# Patient Record
Sex: Female | Born: 1960
Health system: Southern US, Community
[De-identification: ages and names within clinical notes are randomized; demographics above are authoritative.]

## PROBLEM LIST (undated history)

## (undated) DIAGNOSIS — E785 Hyperlipidemia, unspecified: Secondary | ICD-10-CM

## (undated) DIAGNOSIS — M810 Age-related osteoporosis without current pathological fracture: Secondary | ICD-10-CM

## (undated) HISTORY — DX: Hyperlipidemia, unspecified: E78.5

## (undated) HISTORY — PX: CATARACT EXTRACTION, BILATERAL: SHX1313

## (undated) HISTORY — DX: Age-related osteoporosis without current pathological fracture: M81.0

---

## 2005-07-17 ENCOUNTER — Ambulatory Visit: Payer: Self-pay | Admitting: Obstetrics and Gynecology

## 2006-07-18 ENCOUNTER — Ambulatory Visit: Payer: Self-pay | Admitting: Obstetrics and Gynecology

## 2006-07-23 ENCOUNTER — Ambulatory Visit: Payer: Self-pay | Admitting: Obstetrics and Gynecology

## 2006-12-12 ENCOUNTER — Ambulatory Visit: Payer: Self-pay | Admitting: Family Medicine

## 2007-05-16 ENCOUNTER — Ambulatory Visit: Payer: Self-pay | Admitting: Obstetrics and Gynecology

## 2008-05-27 ENCOUNTER — Ambulatory Visit: Payer: Self-pay | Admitting: Obstetrics and Gynecology

## 2008-06-01 ENCOUNTER — Ambulatory Visit: Payer: Self-pay | Admitting: Obstetrics and Gynecology

## 2010-02-13 DIAGNOSIS — C4491 Basal cell carcinoma of skin, unspecified: Secondary | ICD-10-CM

## 2010-02-13 HISTORY — DX: Basal cell carcinoma of skin, unspecified: C44.91

## 2010-02-23 ENCOUNTER — Ambulatory Visit: Payer: Self-pay | Admitting: Obstetrics and Gynecology

## 2011-04-04 ENCOUNTER — Ambulatory Visit: Payer: Self-pay | Admitting: Obstetrics and Gynecology

## 2011-10-08 ENCOUNTER — Ambulatory Visit: Payer: Self-pay | Admitting: Gastroenterology

## 2011-10-09 ENCOUNTER — Ambulatory Visit: Payer: Self-pay | Admitting: Gastroenterology

## 2012-07-08 ENCOUNTER — Emergency Department: Payer: Self-pay | Admitting: Emergency Medicine

## 2012-12-31 ENCOUNTER — Ambulatory Visit: Payer: Self-pay | Admitting: Obstetrics and Gynecology

## 2014-10-18 ENCOUNTER — Ambulatory Visit: Payer: Self-pay | Admitting: Obstetrics and Gynecology

## 2016-03-23 ENCOUNTER — Other Ambulatory Visit: Payer: Self-pay | Admitting: Obstetrics and Gynecology

## 2016-03-23 DIAGNOSIS — Z1231 Encounter for screening mammogram for malignant neoplasm of breast: Secondary | ICD-10-CM

## 2016-04-04 ENCOUNTER — Other Ambulatory Visit: Payer: Self-pay | Admitting: Obstetrics and Gynecology

## 2016-04-04 ENCOUNTER — Ambulatory Visit
Admission: RE | Admit: 2016-04-04 | Discharge: 2016-04-04 | Disposition: A | Discharge status: Home / Self Care | Payer: No Typology Code available for payment source | Source: Ambulatory Visit | Attending: Obstetrics and Gynecology | Admitting: Obstetrics and Gynecology

## 2016-04-04 DIAGNOSIS — Z1231 Encounter for screening mammogram for malignant neoplasm of breast: Secondary | ICD-10-CM | POA: Diagnosis present

## 2016-12-04 DIAGNOSIS — H60542 Acute eczematoid otitis externa, left ear: Secondary | ICD-10-CM | POA: Diagnosis not present

## 2016-12-04 DIAGNOSIS — H6123 Impacted cerumen, bilateral: Secondary | ICD-10-CM | POA: Diagnosis not present

## 2016-12-31 DIAGNOSIS — I6529 Occlusion and stenosis of unspecified carotid artery: Secondary | ICD-10-CM | POA: Diagnosis not present

## 2016-12-31 DIAGNOSIS — E782 Mixed hyperlipidemia: Secondary | ICD-10-CM | POA: Diagnosis not present

## 2016-12-31 DIAGNOSIS — M5010 Cervical disc disorder with radiculopathy, unspecified cervical region: Secondary | ICD-10-CM | POA: Diagnosis not present

## 2017-01-02 DIAGNOSIS — E559 Vitamin D deficiency, unspecified: Secondary | ICD-10-CM | POA: Diagnosis not present

## 2017-01-02 DIAGNOSIS — Z0001 Encounter for general adult medical examination with abnormal findings: Secondary | ICD-10-CM | POA: Diagnosis not present

## 2017-01-02 DIAGNOSIS — R7301 Impaired fasting glucose: Secondary | ICD-10-CM | POA: Diagnosis not present

## 2017-01-02 DIAGNOSIS — E782 Mixed hyperlipidemia: Secondary | ICD-10-CM | POA: Diagnosis not present

## 2017-01-03 ENCOUNTER — Ambulatory Visit
Admission: RE | Admit: 2017-01-03 | Discharge: 2017-01-03 | Disposition: A | Discharge status: Home / Self Care | Payer: Commercial Managed Care - PPO | Source: Ambulatory Visit | Attending: Nurse Practitioner | Admitting: Nurse Practitioner

## 2017-01-03 ENCOUNTER — Other Ambulatory Visit: Payer: Self-pay | Admitting: Nurse Practitioner

## 2017-01-03 DIAGNOSIS — M542 Cervicalgia: Secondary | ICD-10-CM | POA: Diagnosis present

## 2017-01-03 DIAGNOSIS — R52 Pain, unspecified: Secondary | ICD-10-CM

## 2017-01-03 DIAGNOSIS — M5030 Other cervical disc degeneration, unspecified cervical region: Secondary | ICD-10-CM | POA: Insufficient documentation

## 2017-01-23 DIAGNOSIS — I6523 Occlusion and stenosis of bilateral carotid arteries: Secondary | ICD-10-CM | POA: Diagnosis not present

## 2017-02-06 DIAGNOSIS — E782 Mixed hyperlipidemia: Secondary | ICD-10-CM | POA: Diagnosis not present

## 2017-02-06 DIAGNOSIS — M5010 Cervical disc disorder with radiculopathy, unspecified cervical region: Secondary | ICD-10-CM | POA: Diagnosis not present

## 2017-02-06 DIAGNOSIS — I6523 Occlusion and stenosis of bilateral carotid arteries: Secondary | ICD-10-CM | POA: Diagnosis not present

## 2017-05-16 ENCOUNTER — Other Ambulatory Visit: Payer: Self-pay | Admitting: Obstetrics and Gynecology

## 2017-05-16 DIAGNOSIS — Z1231 Encounter for screening mammogram for malignant neoplasm of breast: Secondary | ICD-10-CM

## 2017-06-05 ENCOUNTER — Ambulatory Visit
Admission: RE | Admit: 2017-06-05 | Discharge: 2017-06-05 | Disposition: A | Discharge status: Home / Self Care | Payer: No Typology Code available for payment source | Source: Ambulatory Visit | Attending: Obstetrics and Gynecology | Admitting: Obstetrics and Gynecology

## 2017-06-05 DIAGNOSIS — Z1231 Encounter for screening mammogram for malignant neoplasm of breast: Secondary | ICD-10-CM | POA: Insufficient documentation

## 2017-06-27 DIAGNOSIS — J019 Acute sinusitis, unspecified: Secondary | ICD-10-CM | POA: Diagnosis not present

## 2017-06-27 DIAGNOSIS — R6883 Chills (without fever): Secondary | ICD-10-CM | POA: Diagnosis not present

## 2017-06-27 DIAGNOSIS — R05 Cough: Secondary | ICD-10-CM | POA: Diagnosis not present

## 2017-08-19 DIAGNOSIS — J019 Acute sinusitis, unspecified: Secondary | ICD-10-CM | POA: Diagnosis not present

## 2017-08-19 DIAGNOSIS — M5010 Cervical disc disorder with radiculopathy, unspecified cervical region: Secondary | ICD-10-CM | POA: Diagnosis not present

## 2017-08-19 DIAGNOSIS — M791 Myalgia: Secondary | ICD-10-CM | POA: Diagnosis not present

## 2017-11-27 ENCOUNTER — Ambulatory Visit: Payer: Self-pay | Admitting: Nurse Practitioner

## 2018-01-23 ENCOUNTER — Ambulatory Visit: Payer: Self-pay | Admitting: Nurse Practitioner

## 2018-01-23 ENCOUNTER — Other Ambulatory Visit: Payer: Self-pay | Admitting: Nurse Practitioner

## 2018-01-23 ENCOUNTER — Other Ambulatory Visit: Payer: Self-pay

## 2018-01-23 ENCOUNTER — Telehealth: Payer: Self-pay

## 2018-01-23 DIAGNOSIS — J0140 Acute pansinusitis, unspecified: Secondary | ICD-10-CM

## 2018-01-23 DIAGNOSIS — F411 Generalized anxiety disorder: Secondary | ICD-10-CM

## 2018-01-23 DIAGNOSIS — L209 Atopic dermatitis, unspecified: Secondary | ICD-10-CM

## 2018-01-23 DIAGNOSIS — E782 Mixed hyperlipidemia: Secondary | ICD-10-CM

## 2018-01-23 MED ORDER — TRIAMCINOLONE ACETONIDE 0.025 % EX CREA
1.0000 | TOPICAL_CREAM | Freq: Two times a day (BID) | CUTANEOUS | 0 refills | Status: AC
Start: 2018-01-23 — End: ?

## 2018-01-23 MED ORDER — SIMVASTATIN 20 MG PO TABS: 20.0000 mg | ORAL_TABLET | Freq: Every day | ORAL | 3 refills | Status: DC

## 2018-01-23 MED ORDER — SULFAMETHOXAZOLE-TRIMETHOPRIM 800-160 MG PO TABS: 1.0000 | ORAL_TABLET | Freq: Two times a day (BID) | ORAL | 0 refills | Status: DC

## 2018-01-23 MED ORDER — ALPRAZOLAM 0.5 MG PO TBDP: 0.5000 mg | ORAL_TABLET | Freq: Two times a day (BID) | ORAL | 0 refills | Status: DC | PRN

## 2018-01-23 NOTE — Telephone Encounter (Signed)
-----   Message from Ronnell Freshwater, NP sent at 01/23/2018  1:10 PM EST ----- Contact: [phone] Sent requested refills to [address]. Also sent in bactrim BID for 10 days for sinusitis.  ----- Message ----- From: Corlis Hove Sent: 01/23/2018  11:33 AM To: Ronnell Freshwater, NP    ----- Message ----- From: Gurney Maxin Sent: 01/23/2018   8:59 AM To: Corlis Hove  PT NEED THE FOLLOWING CALLED IN PLEASE SIMVASTAIN 20 MG 20 TABS,TACREAM USP 0.025%,ALPRAZOLAM 0.5 MG XANAX 30 TABS AND ALSO SOMETHING FOR SINUS INFECTION THE SAME THAT THEY SENT HER LAST TIME.

## 2018-01-23 NOTE — Progress Notes (Signed)
Sent requested refills to [address]. Also sent in bactrim BID for 10 days for sinusitis.

## 2018-01-23 NOTE — Telephone Encounter (Signed)
LMOM THAT WE MED AND ALSO ANTIBIOTIC FOR SINUS INFECTION

## 2018-02-25 ENCOUNTER — Ambulatory Visit: Payer: Commercial Managed Care - PPO | Admitting: Nurse Practitioner

## 2018-02-25 ENCOUNTER — Encounter: Payer: Self-pay | Admitting: Nurse Practitioner

## 2018-02-25 VITALS — BP 108/70 | HR 82 | Resp 16 | Ht 59.0 in | Wt 118.0 lb

## 2018-02-25 DIAGNOSIS — G44031 Episodic paroxysmal hemicrania, intractable: Secondary | ICD-10-CM | POA: Diagnosis not present

## 2018-02-25 DIAGNOSIS — F411 Generalized anxiety disorder: Secondary | ICD-10-CM | POA: Insufficient documentation

## 2018-02-25 DIAGNOSIS — H60312 Diffuse otitis externa, left ear: Secondary | ICD-10-CM | POA: Diagnosis not present

## 2018-02-25 DIAGNOSIS — R300 Dysuria: Secondary | ICD-10-CM

## 2018-02-25 DIAGNOSIS — B379 Candidiasis, unspecified: Secondary | ICD-10-CM | POA: Diagnosis not present

## 2018-02-25 LAB — POCT URINALYSIS DIPSTICK
Bilirubin: NEGATIVE
Glucose, UA: NEGATIVE
Ketones, UA: NEGATIVE
LEUKOCYTES UA: NEGATIVE
NITRITE UA: NEGATIVE
PH UA: 7.5 (ref 5.0–8.0)
Protein, UA: NEGATIVE
RBC UA: NEGATIVE
SPEC GRAV UA: 1.01 (ref 1.010–1.025)
UROBILINOGEN UA: 0.2 U/dL

## 2018-02-25 MED ORDER — FLUCONAZOLE 150 MG PO TABS: ORAL_TABLET | ORAL | 0 refills | Status: DC

## 2018-02-25 MED ORDER — ALPRAZOLAM 0.5 MG PO TABS: 0.5000 mg | ORAL_TABLET | Freq: Every evening | ORAL | 3 refills | Status: DC | PRN

## 2018-02-25 MED ORDER — NEOMYCIN-POLYMYXIN-HC 3.5-10000-1 OT SOLN: 4.0000 [drp] | Freq: Three times a day (TID) | OTIC | 0 refills | Status: DC

## 2018-02-25 NOTE — Progress Notes (Signed)
[facility] [place], [place] [address]  Internal MEDICINE  Office Visit Note  Patient Name: Alison Oliver  [id number]  [id number]  Date of Service: 02/25/2018  Chief Complaint  Patient presents with  . Follow-up  . Urinary Tract Infection    burning while urination    The patient is here for routine follow up exam. She reports vaginal itching and irritation. Sometimes has some burning when she urinates. Has been going on for little over a week. She also reports some left ear congestion. Was treated for sinusitis earlier this year. This feeling of water in the ear has not resolved. She is very concerned about this as her sister did have acoustic neuroma. She complained of very similar symptoms prior to this diagnosis.    Pt is here for routine follow up.    Current Medication: Outpatient Encounter Medications as of 02/25/2018  Medication Sig  . ALPRAZolam (XANAX) 0.5 MG tablet Take 1 tablet (0.5 mg total) by mouth at bedtime as needed for anxiety.  . [DISCONTINUED] ALPRAZolam (XANAX) 0.5 MG tablet Take 0.5 mg by mouth at bedtime as needed for anxiety.  Marland Kitchen neomycin-polymyxin-hydrocortisone (CORTISPORIN) OTIC solution Place 4 drops into the left ear 3 (three) times daily.  . simvastatin (ZOCOR) 20 MG tablet Take 1 tablet (20 mg total) by mouth at bedtime.  . sulfamethoxazole-trimethoprim (BACTRIM DS,SEPTRA DS) 800-160 MG tablet Take 1 tablet by mouth 2 (two) times daily. (Patient not taking: Reported on 02/25/2018)  . triamcinolone (KENALOG) 0.025 % cream Apply 1 application topically 2 (two) times daily.  . [DISCONTINUED] ALPRAZolam (NIRAVAM) 0.5 MG dissolvable tablet Take 1 tablet (0.5 mg total) by mouth 2 (two) times daily as needed for anxiety. (Patient not taking: Reported on 02/25/2018)   No facility-administered encounter medications on file as of 02/25/2018.     Surgical History: Past Surgical History:  Procedure Laterality Date  . CESAREAN SECTION       Medical History: Past Medical History:  Diagnosis Date  . Hyperlipidemia   . Osteoporosis     Family History: Family History  Problem Relation Age of Onset  . Breast cancer Maternal Aunt 54  . Breast cancer Maternal Grandmother   . Bladder Cancer Father   . Prostate cancer Brother     Social History   Socioeconomic History  . Marital status: Married    Spouse name: Not on file  . Number of children: Not on file  . Years of education: Not on file  . Highest education level: Not on file  Occupational History  . Not on file  Social Needs  . Financial resource strain: Not on file  . Food insecurity:    Worry: Not on file    Inability: Not on file  . Transportation needs:    Medical: Not on file    Non-medical: Not on file  Tobacco Use  . Smoking status: Never Smoker  . Smokeless tobacco: Never Used  Substance and Sexual Activity  . Alcohol use: Yes    Comment: social   . Drug use: Never  . Sexual activity: Not on file  Lifestyle  . Physical activity:    Days per week: Not on file    Minutes per session: Not on file  . Stress: Not on file  Relationships  . Social connections:    Talks on phone: Not on file    Gets together: Not on file    Attends religious service: Not on file  Active member of club or organization: Not on file    Attends meetings of clubs or organizations: Not on file    Relationship status: Not on file  . Intimate partner violence:    Fear of current or ex partner: Not on file    Emotionally abused: Not on file    Physically abused: Not on file    Forced sexual activity: Not on file  Other Topics Concern  . Not on file  Social History Narrative  . Not on file      Review of Systems  Constitutional: Negative for activity change, chills, fatigue and unexpected weight change.  HENT: Positive for congestion. Negative for postnasal drip, rhinorrhea, sneezing and sore throat.        Feeling of fullness in the ears, especially on  the left side.   Eyes: Negative.  Negative for redness.  Respiratory: Negative for cough, chest tightness and shortness of breath.   Cardiovascular: Negative for chest pain and palpitations.  Gastrointestinal: Negative for abdominal pain, constipation, diarrhea, nausea and vomiting.  Endocrine: Negative for cold intolerance, heat intolerance, polydipsia, polyphagia and polyuria.  Genitourinary: Positive for dysuria. Negative for frequency.       Vaginal itching and irritation  Musculoskeletal: Negative for arthralgias, back pain, joint swelling and neck pain.  Skin: Negative for rash.  Allergic/Immunologic: Positive for environmental allergies.  Neurological: Negative for dizziness, tremors, numbness and headaches.  Hematological: Negative for adenopathy. Does not bruise/bleed easily.  Psychiatric/Behavioral: Negative for behavioral problems (Depression), sleep disturbance and suicidal ideas. The patient is nervous/anxious.     Today's Vitals   02/25/18 0847  BP: 108/70  Pulse: 82  Resp: 16  SpO2: 95%  Weight: 118 lb (53.5 kg)  Height: 4\' 11"  (1.499 m)     Physical Exam  Constitutional: She is oriented to person, place, and time. She appears well-developed and well-nourished. No distress.  HENT:  Head: Normocephalic and atraumatic.  Right Ear: Tympanic membrane is bulging.  Left Ear: Tympanic membrane is bulging.  Mouth/Throat: Oropharynx is clear and moist. No oropharyngeal exudate.  Eyes: Pupils are equal, round, and reactive to light. EOM are normal.  Neck: Normal range of motion. Neck supple. No JVD present. No tracheal deviation present. No thyromegaly present.  Cardiovascular: Normal rate, regular rhythm and normal heart sounds. Exam reveals no gallop and no friction rub.  No murmur heard. Pulmonary/Chest: Effort normal and breath sounds normal. No respiratory distress. She has no wheezes. She has no rales. She exhibits no tenderness.  Abdominal: Soft. Bowel sounds are  normal. There is no tenderness.  Genitourinary:  Genitourinary Comments: Urine sample negative for infection or other abnormalities  Musculoskeletal: Normal range of motion.  Lymphadenopathy:    She has no cervical adenopathy.  Neurological: She is alert and oriented to person, place, and time. No cranial nerve deficit. Coordination normal.  Skin: Skin is warm and dry. She is not diaphoretic.  Psychiatric: She has a normal mood and affect. Her behavior is normal. Judgment and thought content normal.  Nursing note and vitals reviewed.   Assessment/Plan:  1. Chronic diffuse otitis externa of left ear - neomycin-polymyxin-hydrocortisone (CORTISPORIN) OTIC solution; Place 4 drops into the left ear 3 (three) times daily.  Dispense: 10 mL; Refill: 0  2. Dysuria - POCT Urinalysis Dipstick - negative for acute infection or other abnormalities. Suspect yeast infection.  3. Candidiasis - fluconazole (DIFLUCAN) 150 MG tablet; Take 1 tablet po once. May repeat dose in 3 days as needed for  persistent symptoms.  Dispense: 3 tablet; Refill: 0  4. Intractable episodic paroxysmal hemicrania Family history of acoustic neuroma - CT Head Wo Contrast; Future  5. Generalized anxiety disorder - ALPRAZolam (XANAX) 0.5 MG tablet; Take 1 tablet (0.5 mg total) by mouth at bedtime as needed for anxiety.  Dispense: 30 tablet; Refill: 3   General Counseling: Kaaren verbalizes understanding of the findings of todays visit and agrees with plan of treatment. I have discussed any further diagnostic evaluation that may be needed or ordered today. We also reviewed her medications today. she has been encouraged to call the office with any questions or concerns that should arise related to todays visit.  This patient was seen by Leretha Pol, FNP- C in Collaboration with Dr Lavera Guise as a part of collaborative care agreement   Orders Placed This Encounter  Procedures  . CT Head Wo Contrast  . POCT Urinalysis  Dipstick    Meds ordered this encounter  Medications  . neomycin-polymyxin-hydrocortisone (CORTISPORIN) OTIC solution    Sig: Place 4 drops into the left ear 3 (three) times daily.    Dispense:  10 mL    Refill:  0    Order Specific Question:   Supervising Provider    Answer:   Lavera Guise [[id number]]  . ALPRAZolam (XANAX) 0.5 MG tablet    Sig: Take 1 tablet (0.5 mg total) by mouth at bedtime as needed for anxiety.    Dispense:  30 tablet    Refill:  3    Order Specific Question:   Supervising Provider    Answer:   Lavera Guise [[id number]]    Time spent 74 Minutes   Dr Lavera Guise Internal medicine

## 2018-02-27 ENCOUNTER — Other Ambulatory Visit: Payer: Self-pay | Admitting: Nurse Practitioner

## 2018-02-27 ENCOUNTER — Ambulatory Visit: Payer: No Typology Code available for payment source

## 2018-02-27 ENCOUNTER — Telehealth: Payer: Self-pay | Admitting: Nurse Practitioner

## 2018-02-27 DIAGNOSIS — J0140 Acute pansinusitis, unspecified: Secondary | ICD-10-CM

## 2018-02-27 MED ORDER — CLARITHROMYCIN 500 MG PO TABS: 500.0000 mg | ORAL_TABLET | Freq: Two times a day (BID) | ORAL | 0 refills | Status: DC

## 2018-02-27 NOTE — Progress Notes (Signed)
Add biaxine 500mg  bid for 10 days. Continue prescribed ear drops and nasal spray from Tuesday. Over the counter medications to treat symptoms. Gargle with warm salt water to help relieve sore throat.

## 2018-02-27 NOTE — Telephone Encounter (Signed)
PT CALLED AND STATES THAT SHE WAS SEEN ON Tuesday BUT SINCE HAS DEVELOPED A LOW GRADE FEVER , CONGESTION AND A COUGH WOULD LIKE SOMETHING CALLED IN FOR THIS

## 2018-02-27 NOTE — Telephone Encounter (Signed)
Add biaxine 500mg  bid for 10 days. Continue prescribed ear drops and nasal spray from Tuesday. Over the counter medications to treat symptoms. Gargle with warm salt water to help relieve sore throat.

## 2018-02-28 ENCOUNTER — Other Ambulatory Visit: Payer: Self-pay

## 2018-02-28 ENCOUNTER — Other Ambulatory Visit: Payer: Self-pay | Admitting: Nurse Practitioner

## 2018-02-28 DIAGNOSIS — J0140 Acute pansinusitis, unspecified: Secondary | ICD-10-CM

## 2018-02-28 DIAGNOSIS — K581 Irritable bowel syndrome with constipation: Secondary | ICD-10-CM

## 2018-02-28 MED ORDER — FLUTICASONE PROPIONATE 50 MCG/ACT NA SUSP: 2.0000 | Freq: Every day | NASAL | 6 refills | Status: DC

## 2018-02-28 MED ORDER — LINACLOTIDE 72 MCG PO CAPS: 72.0000 ug | ORAL_CAPSULE | Freq: Every day | ORAL | 3 refills | Status: DC

## 2018-02-28 NOTE — Telephone Encounter (Signed)
Added fluticasone nasal spray - use 1 to 2 sprays in both nostrils daily to help relieve congestion. Also added linzess 27mcg daily to help with regulating the bowels. Sent both prescriptions to [facility] on Friday afternoon.

## 2018-02-28 NOTE — Telephone Encounter (Signed)
Called in bactrim ds 800-600 mg take 1 bid for 10 days as per heather because clarithromycin is contraindicated with simvastatin due to potential liver dysfunction and rabdo and pt advised

## 2018-02-28 NOTE — Progress Notes (Signed)
Added fluticasone nasal spray - use 1 to 2 sprays in both nostrils daily to help relieve congestion. Also added linzess 85mcg daily to help with regulating the bowels. Sent both prescriptions to [facility] on Friday afternoon.

## 2018-03-06 ENCOUNTER — Ambulatory Visit
Admission: RE | Admit: 2018-03-06 | Discharge: 2018-03-06 | Disposition: A | Discharge status: Home / Self Care | Payer: Commercial Managed Care - PPO | Source: Ambulatory Visit | Attending: Nurse Practitioner | Admitting: Nurse Practitioner

## 2018-03-06 DIAGNOSIS — R42 Dizziness and giddiness: Secondary | ICD-10-CM | POA: Diagnosis not present

## 2018-03-06 DIAGNOSIS — G44031 Episodic paroxysmal hemicrania, intractable: Secondary | ICD-10-CM | POA: Diagnosis present

## 2018-03-27 ENCOUNTER — Other Ambulatory Visit: Payer: Self-pay | Admitting: Nurse Practitioner

## 2018-03-27 DIAGNOSIS — Z1231 Encounter for screening mammogram for malignant neoplasm of breast: Secondary | ICD-10-CM

## 2018-04-10 ENCOUNTER — Telehealth: Payer: Self-pay

## 2018-04-10 ENCOUNTER — Other Ambulatory Visit: Payer: Self-pay | Admitting: Nurse Practitioner

## 2018-04-10 DIAGNOSIS — L247 Irritant contact dermatitis due to plants, except food: Secondary | ICD-10-CM

## 2018-04-10 MED ORDER — PREDNISONE 10 MG (21) PO TBPK: ORAL_TABLET | ORAL | 0 refills | Status: DC

## 2018-04-10 NOTE — Progress Notes (Signed)
Sent in prednisone 6 day dose pack. Take as directed for 6 days  Should also consider using OTC Zenfel which is wash for help dry out lesions.

## 2018-04-10 NOTE — Telephone Encounter (Signed)
Left vm informing pt that pred pk sent to pharmacy and instruction to use OTC medication.  dbs

## 2018-04-10 NOTE — Telephone Encounter (Signed)
Sent in prednisone 6 day dose pack. Take as directed for 6 days  Should also consider using OTC Zenfel which is wash for help dry out lesions.

## 2018-05-27 ENCOUNTER — Other Ambulatory Visit: Payer: Self-pay | Admitting: Nurse Practitioner

## 2018-05-27 ENCOUNTER — Telehealth: Payer: Self-pay

## 2018-05-27 DIAGNOSIS — H00019 Hordeolum externum unspecified eye, unspecified eyelid: Secondary | ICD-10-CM

## 2018-05-27 MED ORDER — POLYMYXIN B-TRIMETHOPRIM 10000-0.1 UNIT/ML-% OP SOLN: 1.0000 [drp] | OPHTHALMIC | 0 refills | Status: DC

## 2018-05-27 NOTE — Progress Notes (Signed)
Patient called c/o sty. Sent new rx for polytrim eye drops to pharmacy. Use 1 drop in eye every 4 hours while awake. Warm compress to affected eye in 15 minute intervals.

## 2018-06-03 NOTE — Telephone Encounter (Signed)
Pt informed prescription sent to her pharmacy

## 2018-07-01 ENCOUNTER — Ambulatory Visit: Payer: Commercial Managed Care - PPO | Admitting: Nurse Practitioner

## 2018-07-01 ENCOUNTER — Encounter: Payer: Self-pay | Admitting: Nurse Practitioner

## 2018-07-01 VITALS — BP 112/67 | HR 67 | Resp 16 | Ht 59.0 in | Wt 118.0 lb

## 2018-07-01 DIAGNOSIS — E782 Mixed hyperlipidemia: Secondary | ICD-10-CM

## 2018-07-01 DIAGNOSIS — F411 Generalized anxiety disorder: Secondary | ICD-10-CM

## 2018-07-01 DIAGNOSIS — R300 Dysuria: Secondary | ICD-10-CM | POA: Diagnosis not present

## 2018-07-01 DIAGNOSIS — B3731 Acute candidiasis of vulva and vagina: Secondary | ICD-10-CM

## 2018-07-01 DIAGNOSIS — L209 Atopic dermatitis, unspecified: Secondary | ICD-10-CM

## 2018-07-01 DIAGNOSIS — Z0001 Encounter for general adult medical examination with abnormal findings: Secondary | ICD-10-CM

## 2018-07-01 DIAGNOSIS — E559 Vitamin D deficiency, unspecified: Secondary | ICD-10-CM

## 2018-07-01 DIAGNOSIS — J0140 Acute pansinusitis, unspecified: Secondary | ICD-10-CM | POA: Diagnosis not present

## 2018-07-01 DIAGNOSIS — B373 Candidiasis of vulva and vagina: Secondary | ICD-10-CM

## 2018-07-01 LAB — POCT URINALYSIS DIPSTICK
Bilirubin, UA: NEGATIVE
Blood, UA: NEGATIVE
Glucose, UA: NEGATIVE
Ketones, UA: NEGATIVE
Leukocytes, UA: NEGATIVE
Nitrite, UA: NEGATIVE
Protein, UA: NEGATIVE
Spec Grav, UA: >=1.03 — AB (ref 1.010–1.025)
Urobilinogen, UA: 0.2 E.U./dL
pH, UA: 6 (ref 5.0–8.0)

## 2018-07-01 MED ORDER — FLUCONAZOLE 150 MG PO TABS: ORAL_TABLET | ORAL | 0 refills | Status: DC

## 2018-07-01 MED ORDER — ALPRAZOLAM 0.5 MG PO TABS: 0.5000 mg | ORAL_TABLET | Freq: Every evening | ORAL | 3 refills | Status: DC | PRN

## 2018-07-01 MED ORDER — NYSTATIN 100000 UNIT/GM EX POWD: Freq: Four times a day (QID) | CUTANEOUS | 0 refills | Status: DC

## 2018-07-01 MED ORDER — DESOXIMETASONE 0.25 % EX CREA: 1.0000 "application " | TOPICAL_CREAM | Freq: Two times a day (BID) | CUTANEOUS | 0 refills | Status: DC

## 2018-07-01 MED ORDER — AZITHROMYCIN 250 MG PO TABS
ORAL_TABLET | ORAL | 0 refills | Status: DC
Start: 2018-07-01 — End: 2019-01-02

## 2018-07-01 NOTE — Progress Notes (Signed)
[facility] [place], [place] [address]  Internal MEDICINE  Office Visit Note  Patient Name: Alison Oliver  [id number]  [id number]  Date of Service: 07/10/2018  Chief Complaint  Patient presents with  . Urinary Tract Infection  . Sinusitis    The patient is c/o vaginal itching and irritation for past few days. Does note some burning of the skin when she urinates. She denies bladder pain or spasms. She denies abdominal pain, fatigue or fever.  Also states that she has headache, nasal congestion, and sneezing for past few days. Has taken OTC allergy medications, but have not helped that much.       Current Medication: Outpatient Encounter Medications as of 07/01/2018  Medication Sig  . ALPRAZolam (XANAX) 0.5 MG tablet Take 1 tablet (0.5 mg total) by mouth at bedtime as needed for anxiety.  . fluticasone (FLONASE) 50 MCG/ACT nasal spray Place 2 sprays into both nostrils daily.  Marland Kitchen linaclotide (LINZESS) 72 MCG capsule Take 1 capsule (72 mcg total) by mouth daily before breakfast.  . simvastatin (ZOCOR) 20 MG tablet Take 1 tablet (20 mg total) by mouth at bedtime.  . sulfamethoxazole-trimethoprim (BACTRIM DS,SEPTRA DS) 800-160 MG tablet Take 1 tab po bid for 10 days  . triamcinolone (KENALOG) 0.025 % cream Apply 1 application topically 2 (two) times daily.  Marland Kitchen trimethoprim-polymyxin b (POLYTRIM) ophthalmic solution Place 1 drop into both eyes every 4 (four) hours.  . [DISCONTINUED] ALPRAZolam (XANAX) 0.5 MG tablet Take 1 tablet (0.5 mg total) by mouth at bedtime as needed for anxiety.  . [DISCONTINUED] fluconazole (DIFLUCAN) 150 MG tablet Take 1 tablet po once. May repeat dose in 3 days as needed for persistent symptoms.  Marland Kitchen azithromycin (ZITHROMAX) 250 MG tablet z-pack - take as directed for 5 days  . desoximetasone (TOPICORT) 0.25 % cream Apply 1 application topically 2 (two) times daily.  . fluconazole (DIFLUCAN) 150 MG tablet Take 1 tablet po once. May  repeat dose in 3 days as needed for persistent symptoms.  Marland Kitchen neomycin-polymyxin-hydrocortisone (CORTISPORIN) OTIC solution Place 4 drops into the left ear 3 (three) times daily. (Patient not taking: Reported on 07/01/2018)  . nystatin (MYCOSTATIN/NYSTOP) powder Apply topically 4 (four) times daily.  . predniSONE (STERAPRED UNI-PAK 21 TAB) 10 MG (21) TBPK tablet 6 day taper - take by mouth as directed for 6 days (Patient not taking: Reported on 07/01/2018)   No facility-administered encounter medications on file as of 07/01/2018.     Surgical History: Past Surgical History:  Procedure Laterality Date  . CESAREAN SECTION      Medical History: Past Medical History:  Diagnosis Date  . Hyperlipidemia   . Osteoporosis     Family History: Family History  Problem Relation Age of Onset  . Breast cancer Maternal Aunt 59  . Breast cancer Maternal Grandmother   . Bladder Cancer Father   . Prostate cancer Brother     Social History   Socioeconomic History  . Marital status: Married    Spouse name: Not on file  . Number of children: Not on file  . Years of education: Not on file  . Highest education level: Not on file  Occupational History  . Not on file  Social Needs  . Financial resource strain: Not on file  . Food insecurity:    Worry: Not on file    Inability: Not on file  . Transportation needs:    Medical: Not on file    Non-medical: Not on  file  Tobacco Use  . Smoking status: Never Smoker  . Smokeless tobacco: Never Used  Substance and Sexual Activity  . Alcohol use: Yes    Comment: social   . Drug use: Never  . Sexual activity: Not on file  Lifestyle  . Physical activity:    Days per week: Not on file    Minutes per session: Not on file  . Stress: Not on file  Relationships  . Social connections:    Talks on phone: Not on file    Gets together: Not on file    Attends religious service: Not on file    Active member of club or organization: Not on file    Attends  meetings of clubs or organizations: Not on file    Relationship status: Not on file  . Intimate partner violence:    Fear of current or ex partner: Not on file    Emotionally abused: Not on file    Physically abused: Not on file    Forced sexual activity: Not on file  Other Topics Concern  . Not on file  Social History Narrative  . Not on file      Review of Systems  Constitutional: Negative for activity change, chills, fatigue and unexpected weight change.  HENT: Positive for congestion, postnasal drip, rhinorrhea and sinus pressure. Negative for sneezing and sore throat.        Feeling of fullness in the ears, especially on the left side.   Eyes: Negative.  Negative for redness.  Respiratory: Negative for cough, chest tightness, shortness of breath and wheezing.   Cardiovascular: Negative for chest pain and palpitations.  Gastrointestinal: Negative for abdominal pain, constipation, diarrhea, nausea and vomiting.  Endocrine: Negative for cold intolerance, heat intolerance, polydipsia, polyphagia and polyuria.  Genitourinary: Positive for dysuria. Negative for frequency.       Vaginal itching and irritation  Musculoskeletal: Negative for arthralgias, back pain, joint swelling and neck pain.  Skin: Negative for rash.  Allergic/Immunologic: Positive for environmental allergies.  Neurological: Positive for headaches. Negative for dizziness, tremors and numbness.  Hematological: Negative for adenopathy. Does not bruise/bleed easily.  Psychiatric/Behavioral: Negative for behavioral problems (Depression), sleep disturbance and suicidal ideas. The patient is nervous/anxious.     Today's Vitals   07/01/18 0851  BP: 112/67  Pulse: 67  Resp: 16  SpO2: 99%  Weight: 118 lb (53.5 kg)  Height: 4\' 11"  (1.499 m)    Physical Exam  Constitutional: She is oriented to person, place, and time. She appears well-developed and well-nourished. No distress.  HENT:  Head: Normocephalic and  atraumatic.  Right Ear: Tympanic membrane is bulging.  Left Ear: Tympanic membrane is bulging.  Nose: Rhinorrhea present. Right sinus exhibits maxillary sinus tenderness. Left sinus exhibits maxillary sinus tenderness.  Mouth/Throat: Posterior oropharyngeal erythema present. No oropharyngeal exudate.  Eyes: Pupils are equal, round, and reactive to light. EOM are normal.  Neck: Normal range of motion. Neck supple. No JVD present. No tracheal deviation present. No thyromegaly present.  Cardiovascular: Normal rate, regular rhythm and normal heart sounds. Exam reveals no gallop and no friction rub.  No murmur heard. Pulmonary/Chest: Effort normal and breath sounds normal. No respiratory distress. She has no wheezes. She has no rales. She exhibits no tenderness.  Abdominal: Soft. Bowel sounds are normal. There is no tenderness.  Genitourinary:  Genitourinary Comments: Urine sample negative for infection or other abnormalities  Musculoskeletal: Normal range of motion.  Lymphadenopathy:    She has no cervical  adenopathy.  Neurological: She is alert and oriented to person, place, and time. No cranial nerve deficit. Coordination normal.  Skin: Skin is warm and dry. She is not diaphoretic.  Psychiatric: She has a normal mood and affect. Her behavior is normal. Judgment and thought content normal.  Nursing note and vitals reviewed.  Assessment/Plan: 1. Acute non-recurrent pansinusitis Start z-pack. Take as directed for 5 days. OTC medications should be taken as needed to alleviate symptoms.  - azithromycin (ZITHROMAX) 250 MG tablet; z-pack - take as directed for 5 days  Dispense: 6 tablet; Refill: 0  2. Dysuria - POCT Urinalysis Dipstick negative for evidence of infection or other abnormalities.   3. Vaginal candidiasis - fluconazole (DIFLUCAN) 150 MG tablet; Take 1 tablet po once. May repeat dose in 3 days as needed for persistent symptoms.  Dispense: 3 tablet; Refill: 0 - nystatin  (MYCOSTATIN/NYSTOP) powder; Apply topically 4 (four) times daily.  Dispense: 15 g; Refill: 0  4. Atopic dermatitis, unspecified type - desoximetasone (TOPICORT) 0.25 % cream; Apply 1 application topically 2 (two) times daily.  Dispense: 30 g; Refill: 0  5. Generalized anxiety disorder May continue alprazolam 0.5mg  at bedtime as needed for acute anxiety. New prescription provided today.  - ALPRAZolam (XANAX) 0.5 MG tablet; Take 1 tablet (0.5 mg total) by mouth at bedtime as needed for anxiety.  Dispense: 30 tablet; Refill: 3  6. Mixed hyperlipidemia - Comprehensive metabolic panel - Lipid panel  7. Vitamin D deficiency - Vitamin D 1,25 dihydroxy    General Counseling: Timika verbalizes understanding of the findings of todays visit and agrees with plan of treatment. I have discussed any further diagnostic evaluation that may be needed or ordered today. We also reviewed her medications today. she has been encouraged to call the office with any questions or concerns that should arise related to todays visit.  This patient was seen by Leretha Pol FNP Collaboration with Dr Lavera Guise as a part of collaborative care agreement  Orders Placed This Encounter  Procedures  . CBC with Differential/Platelet  . Comprehensive metabolic panel  . T4, free  . TSH  . Lipid panel  . Vitamin D 1,25 dihydroxy  . POCT Urinalysis Dipstick    Meds ordered this encounter  Medications  . azithromycin (ZITHROMAX) 250 MG tablet    Sig: z-pack - take as directed for 5 days    Dispense:  6 tablet    Refill:  0    Order Specific Question:   Supervising Provider    Answer:   Lavera Guise [place]  . fluconazole (DIFLUCAN) 150 MG tablet    Sig: Take 1 tablet po once. May repeat dose in 3 days as needed for persistent symptoms.    Dispense:  3 tablet    Refill:  0    Order Specific Question:   Supervising Provider    Answer:   Lavera Guise [[id number]]  . desoximetasone (TOPICORT) 0.25 % cream    Sig:  Apply 1 application topically 2 (two) times daily.    Dispense:  30 g    Refill:  0    Order Specific Question:   Supervising Provider    Answer:   Lavera Guise [[id number]]  . nystatin (MYCOSTATIN/NYSTOP) powder    Sig: Apply topically 4 (four) times daily.    Dispense:  15 g    Refill:  0    Order Specific Question:   Supervising Provider    Answer:   Lavera Guise [  [id number]]  . ALPRAZolam (XANAX) 0.5 MG tablet    Sig: Take 1 tablet (0.5 mg total) by mouth at bedtime as needed for anxiety.    Dispense:  30 tablet    Refill:  3    Order Specific Question:   Supervising Provider    Answer:   Lavera Guise [[id number]]    Time spent: 87 Minutes      Dr Lavera Guise Internal medicine

## 2018-07-10 DIAGNOSIS — J0140 Acute pansinusitis, unspecified: Secondary | ICD-10-CM | POA: Insufficient documentation

## 2018-07-10 DIAGNOSIS — E559 Vitamin D deficiency, unspecified: Secondary | ICD-10-CM | POA: Insufficient documentation

## 2018-07-10 DIAGNOSIS — E782 Mixed hyperlipidemia: Secondary | ICD-10-CM | POA: Insufficient documentation

## 2018-07-10 DIAGNOSIS — Z0001 Encounter for general adult medical examination with abnormal findings: Secondary | ICD-10-CM | POA: Insufficient documentation

## 2018-07-10 DIAGNOSIS — L209 Atopic dermatitis, unspecified: Secondary | ICD-10-CM | POA: Insufficient documentation

## 2018-07-10 DIAGNOSIS — B373 Candidiasis of vulva and vagina: Secondary | ICD-10-CM | POA: Insufficient documentation

## 2018-07-10 DIAGNOSIS — B3731 Acute candidiasis of vulva and vagina: Secondary | ICD-10-CM | POA: Insufficient documentation

## 2018-07-23 ENCOUNTER — Other Ambulatory Visit: Payer: Self-pay | Admitting: Nurse Practitioner

## 2018-07-23 DIAGNOSIS — E782 Mixed hyperlipidemia: Secondary | ICD-10-CM | POA: Diagnosis not present

## 2018-07-23 DIAGNOSIS — E559 Vitamin D deficiency, unspecified: Secondary | ICD-10-CM | POA: Diagnosis not present

## 2018-07-23 DIAGNOSIS — Z0001 Encounter for general adult medical examination with abnormal findings: Secondary | ICD-10-CM | POA: Diagnosis not present

## 2018-07-24 LAB — COMPREHENSIVE METABOLIC PANEL
ALT: 11 IU/L (ref 0–32)
AST: 18 IU/L (ref 0–40)
Albumin/Globulin Ratio: 1.8 (ref 1.2–2.2)
Albumin: 4.4 g/dL (ref 3.5–5.5)
Alkaline Phosphatase: 100 IU/L (ref 39–117)
BUN/Creatinine Ratio: 21 (ref 9–23)
BUN: 14 mg/dL (ref 6–24)
Bilirubin Total: 0.4 mg/dL (ref 0.0–1.2)
CALCIUM: 9.6 mg/dL (ref 8.7–10.2)
CO2: 24 mmol/L (ref 20–29)
Chloride: 103 mmol/L (ref 96–106)
Creatinine, Ser: 0.66 mg/dL (ref 0.57–1.00)
GFR, EST AFRICAN AMERICAN: 113 mL/min/{1.73_m2} (ref >59)
GFR, EST NON AFRICAN AMERICAN: 98 mL/min/{1.73_m2} (ref >59)
GLOBULIN, TOTAL: 2.5 g/dL (ref 1.5–4.5)
Glucose: 89 mg/dL (ref 65–99)
Potassium: 4.1 mmol/L (ref 3.5–5.2)
SODIUM: 142 mmol/L (ref 134–144)
TOTAL PROTEIN: 6.9 g/dL (ref 6.0–8.5)

## 2018-07-24 LAB — CBC
Hematocrit: 38.9 % (ref 34.0–46.6)
Hemoglobin: 13.2 g/dL (ref 11.1–15.9)
MCH: 29.5 pg (ref 26.6–33.0)
MCHC: 33.9 g/dL (ref 31.5–35.7)
MCV: 87 fL (ref 79–97)
Platelets: 264 10*3/uL (ref 150–450)
RBC: 4.47 x10E6/uL (ref 3.77–5.28)
RDW: 13.4 % (ref 12.3–15.4)
WBC: 6.2 10*3/uL (ref 3.4–10.8)

## 2018-07-24 LAB — LIPID PANEL W/O CHOL/HDL RATIO
Cholesterol, Total: 184 mg/dL (ref 100–199)
HDL: 38 mg/dL — ABNORMAL LOW (ref >39)
LDL CALC: 119 mg/dL — AB (ref 0–99)
Triglycerides: 135 mg/dL (ref 0–149)
VLDL CHOLESTEROL CAL: 27 mg/dL (ref 5–40)

## 2018-07-24 LAB — T4, FREE: Free T4: 1.11 ng/dL (ref 0.82–1.77)

## 2018-07-24 LAB — VITAMIN D 25 HYDROXY (VIT D DEFICIENCY, FRACTURES): Vit D, 25-Hydroxy: 78.7 ng/mL (ref 30.0–100.0)

## 2018-07-24 LAB — TSH: TSH: 1.9 u[IU]/mL (ref 0.450–4.500)

## 2018-08-15 ENCOUNTER — Telehealth: Payer: Self-pay

## 2018-08-15 NOTE — Telephone Encounter (Signed)
Pt advised labs came back good

## 2018-08-26 DIAGNOSIS — Z1211 Encounter for screening for malignant neoplasm of colon: Secondary | ICD-10-CM | POA: Diagnosis not present

## 2018-08-26 DIAGNOSIS — Z1212 Encounter for screening for malignant neoplasm of rectum: Secondary | ICD-10-CM | POA: Diagnosis not present

## 2018-09-04 ENCOUNTER — Telehealth: Payer: Self-pay

## 2018-09-04 NOTE — Telephone Encounter (Signed)
LMOM to  Call us back

## 2018-09-04 NOTE — Telephone Encounter (Signed)
Pt advised  cologuard is negative  

## 2018-09-05 ENCOUNTER — Encounter: Payer: Self-pay | Admitting: Nurse Practitioner

## 2018-09-05 LAB — COLOGUARD

## 2018-09-05 NOTE — Progress Notes (Signed)
SCANNED IN COLOGUARD REPORT RESULTED ON 09/01/18.

## 2018-09-18 ENCOUNTER — Ambulatory Visit
Admission: RE | Admit: 2018-09-18 | Discharge: 2018-09-18 | Disposition: A | Discharge status: Home / Self Care | Payer: Commercial Managed Care - PPO | Source: Ambulatory Visit | Attending: Nurse Practitioner | Admitting: Nurse Practitioner

## 2018-09-18 DIAGNOSIS — Z1231 Encounter for screening mammogram for malignant neoplasm of breast: Secondary | ICD-10-CM

## 2019-01-02 ENCOUNTER — Ambulatory Visit: Payer: Commercial Managed Care - PPO | Admitting: Nurse Practitioner

## 2019-01-02 ENCOUNTER — Encounter: Payer: Self-pay | Admitting: Nurse Practitioner

## 2019-01-02 VITALS — BP 110/70 | HR 76 | Resp 16 | Ht 59.0 in | Wt 114.0 lb

## 2019-01-02 DIAGNOSIS — E782 Mixed hyperlipidemia: Secondary | ICD-10-CM | POA: Diagnosis not present

## 2019-01-02 DIAGNOSIS — F411 Generalized anxiety disorder: Secondary | ICD-10-CM

## 2019-01-02 MED ORDER — ALPRAZOLAM 0.5 MG PO TABS: 0.5000 mg | ORAL_TABLET | Freq: Every evening | ORAL | 3 refills | Status: DC | PRN

## 2019-01-02 NOTE — Progress Notes (Signed)
[facility] [place], [place] [address]  Internal MEDICINE  Office Visit Note  Patient Name: Alison Oliver  [id number]  [id number]  Date of Service: 01/02/2019  Chief Complaint  Patient presents with  . Hyperlipidemia  . Follow-up    The patient is here for routine follow up visit. She has no physical concerns or complaints. admists to having increased family stress. She will take alprazolam as needed nd as prescribed for this. Helps reduce anxiety and does not have negative side effects. She needs to have refills for this as well as other routine medication today. She will be due, later this year, for routine physical and pap smear. It is time to check routine, fasting labs.       Current Medication: Outpatient Encounter Medications as of 01/02/2019  Medication Sig  . ALPRAZolam (XANAX) 0.5 MG tablet Take 1 tablet (0.5 mg total) by mouth at bedtime as needed for anxiety.  Marland Kitchen desoximetasone (TOPICORT) 0.25 % cream Apply 1 application topically 2 (two) times daily.  . fluconazole (DIFLUCAN) 150 MG tablet Take 1 tablet po once. May repeat dose in 3 days as needed for persistent symptoms.  . fluticasone (FLONASE) 50 MCG/ACT nasal spray Place 2 sprays into both nostrils daily.  Marland Kitchen linaclotide (LINZESS) 72 MCG capsule Take 1 capsule (72 mcg total) by mouth daily before breakfast.  . simvastatin (ZOCOR) 20 MG tablet Take 1 tablet (20 mg total) by mouth at bedtime.  . triamcinolone (KENALOG) 0.025 % cream Apply 1 application topically 2 (two) times daily.  Marland Kitchen trimethoprim-polymyxin b (POLYTRIM) ophthalmic solution Place 1 drop into both eyes every 4 (four) hours.  . [DISCONTINUED] ALPRAZolam (XANAX) 0.5 MG tablet Take 1 tablet (0.5 mg total) by mouth at bedtime as needed for anxiety.  . [DISCONTINUED] azithromycin (ZITHROMAX) 250 MG tablet z-pack - take as directed for 5 days  . [DISCONTINUED] nystatin (MYCOSTATIN/NYSTOP) powder Apply topically 4 (four) times daily.   . [DISCONTINUED] sulfamethoxazole-trimethoprim (BACTRIM DS,SEPTRA DS) 800-160 MG tablet Take 1 tab po bid for 10 days  . [DISCONTINUED] neomycin-polymyxin-hydrocortisone (CORTISPORIN) OTIC solution Place 4 drops into the left ear 3 (three) times daily. (Patient not taking: Reported on 01/02/2019)  . [DISCONTINUED] predniSONE (STERAPRED UNI-PAK 21 TAB) 10 MG (21) TBPK tablet 6 day taper - take by mouth as directed for 6 days (Patient not taking: Reported on 01/02/2019)   No facility-administered encounter medications on file as of 01/02/2019.     Surgical History: Past Surgical History:  Procedure Laterality Date  . CESAREAN SECTION      Medical History: Past Medical History:  Diagnosis Date  . Hyperlipidemia   . Osteoporosis     Family History: Family History  Problem Relation Age of Onset  . Breast cancer Maternal Aunt 53  . Breast cancer Maternal Grandmother   . Bladder Cancer Father   . Prostate cancer Brother     Social History   Socioeconomic History  . Marital status: Married    Spouse name: Not on file  . Number of children: Not on file  . Years of education: Not on file  . Highest education level: Not on file  Occupational History  . Not on file  Social Needs  . Financial resource strain: Not on file  . Food insecurity:    Worry: Not on file    Inability: Not on file  . Transportation needs:    Medical: Not on file    Non-medical: Not on file  Tobacco Use  .  Smoking status: Never Smoker  . Smokeless tobacco: Never Used  Substance and Sexual Activity  . Alcohol use: Yes    Comment: social   . Drug use: Never  . Sexual activity: Not on file  Lifestyle  . Physical activity:    Days per week: Not on file    Minutes per session: Not on file  . Stress: Not on file  Relationships  . Social connections:    Talks on phone: Not on file    Gets together: Not on file    Attends religious service: Not on file    Active member of club or organization: Not on  file    Attends meetings of clubs or organizations: Not on file    Relationship status: Not on file  . Intimate partner violence:    Fear of current or ex partner: Not on file    Emotionally abused: Not on file    Physically abused: Not on file    Forced sexual activity: Not on file  Other Topics Concern  . Not on file  Social History Narrative  . Not on file      Review of Systems  Constitutional: Negative for activity change, chills, fatigue and unexpected weight change.  HENT: Negative for congestion, postnasal drip, rhinorrhea, sinus pressure, sneezing and sore throat.   Respiratory: Negative for cough, chest tightness, shortness of breath and wheezing.   Cardiovascular: Negative for chest pain and palpitations.  Gastrointestinal: Negative for abdominal pain, constipation, diarrhea, nausea and vomiting.  Endocrine: Negative for cold intolerance, heat intolerance, polydipsia, polyphagia and polyuria.  Musculoskeletal: Negative for arthralgias, back pain, joint swelling and neck pain.  Skin: Negative for rash.  Allergic/Immunologic: Positive for environmental allergies.  Neurological: Positive for headaches. Negative for dizziness, tremors and numbness.  Hematological: Negative for adenopathy. Does not bruise/bleed easily.  Psychiatric/Behavioral: Negative for behavioral problems (Depression), sleep disturbance and suicidal ideas. The patient is nervous/anxious.    Today's Vitals   01/02/19 0836  BP: 110/70  Pulse: 76  Resp: 16  SpO2: 98%  Weight: 114 lb (51.7 kg)  Height: 4\' 11"  (1.499 m)   Body mass index is 23.03 kg/m.  Physical Exam Vitals signs and nursing note reviewed.  Constitutional:      General: She is not in acute distress.    Appearance: Normal appearance. She is well-developed. She is not diaphoretic.  HENT:     Head: Normocephalic and atraumatic.     Mouth/Throat:     Pharynx: No oropharyngeal exudate.  Eyes:     Pupils: Pupils are equal, round,  and reactive to light.  Neck:     Musculoskeletal: Normal range of motion and neck supple.     Thyroid: No thyromegaly.     Vascular: No JVD.     Trachea: No tracheal deviation.  Cardiovascular:     Rate and Rhythm: Normal rate and regular rhythm.     Heart sounds: Normal heart sounds. No murmur. No friction rub. No gallop.   Pulmonary:     Effort: Pulmonary effort is normal. No respiratory distress.     Breath sounds: Normal breath sounds. No wheezing or rales.  Chest:     Chest wall: No tenderness.  Abdominal:     General: Bowel sounds are normal.     Palpations: Abdomen is soft.     Tenderness: There is no abdominal tenderness.  Genitourinary:    Comments: Urine sample negative for infection or other abnormalities Musculoskeletal: Normal range of motion.  Lymphadenopathy:     Cervical: No cervical adenopathy.  Skin:    General: Skin is warm and dry.  Neurological:     Mental Status: She is alert and oriented to person, place, and time.     Cranial Nerves: No cranial nerve deficit.     Coordination: Coordination normal.  Psychiatric:        Behavior: Behavior normal.        Thought Content: Thought content normal.        Judgment: Judgment normal.    Assessment/Plan: 1. Mixed hyperlipidemia Check fating lipid panel and adjust statin as indicated.   2. Generalized anxiety disorder May continue alprazolam 0.5mg  at bedtime as needed for anxiety/insomnia. New prescription sent to pharmacy. - ALPRAZolam Duanne Moron) 0.5 MG tablet; Take 1 tablet (0.5 mg total) by mouth at bedtime as needed for anxiety.  Dispense: 30 tablet; Refill: 3  General Counseling: Seraphina verbalizes understanding of the findings of todays visit and agrees with plan of treatment. I have discussed any further diagnostic evaluation that may be needed or ordered today. We also reviewed her medications today. she has been encouraged to call the office with any questions or concerns that should arise related to  todays visit.   This patient was seen by [place] with Dr Lavera Guise as a part of collaborative care agreement  Meds ordered this encounter  Medications  . ALPRAZolam (XANAX) 0.5 MG tablet    Sig: Take 1 tablet (0.5 mg total) by mouth at bedtime as needed for anxiety.    Dispense:  30 tablet    Refill:  3    Order Specific Question:   Supervising Provider    Answer:   Lavera Guise [[id number]]    Time spent: 75 Minutes      Dr Lavera Guise Internal medicine

## 2019-01-30 ENCOUNTER — Other Ambulatory Visit: Payer: Self-pay

## 2019-01-30 DIAGNOSIS — E782 Mixed hyperlipidemia: Secondary | ICD-10-CM

## 2019-01-30 MED ORDER — SIMVASTATIN 20 MG PO TABS: 20.0000 mg | ORAL_TABLET | Freq: Every day | ORAL | 3 refills | Status: DC

## 2019-03-09 ENCOUNTER — Other Ambulatory Visit: Payer: Self-pay

## 2019-03-09 DIAGNOSIS — B373 Candidiasis of vulva and vagina: Secondary | ICD-10-CM

## 2019-03-09 DIAGNOSIS — B3731 Acute candidiasis of vulva and vagina: Secondary | ICD-10-CM

## 2019-03-09 MED ORDER — FLUCONAZOLE 150 MG PO TABS: ORAL_TABLET | ORAL | 0 refills | Status: DC

## 2019-03-10 ENCOUNTER — Other Ambulatory Visit: Payer: Self-pay

## 2019-03-10 DIAGNOSIS — J0140 Acute pansinusitis, unspecified: Secondary | ICD-10-CM

## 2019-03-10 MED ORDER — FLUTICASONE PROPIONATE 50 MCG/ACT NA SUSP: 2.0000 | Freq: Every day | NASAL | 6 refills | Status: DC

## 2019-07-03 ENCOUNTER — Other Ambulatory Visit: Payer: Self-pay | Admitting: Nurse Practitioner

## 2019-07-10 ENCOUNTER — Encounter: Payer: Self-pay | Admitting: Nurse Practitioner

## 2019-07-10 ENCOUNTER — Ambulatory Visit: Payer: Commercial Managed Care - PPO | Admitting: Nurse Practitioner

## 2019-07-10 ENCOUNTER — Other Ambulatory Visit: Payer: Self-pay

## 2019-07-10 VITALS — Ht 59.0 in | Wt 114.0 lb

## 2019-07-10 DIAGNOSIS — F411 Generalized anxiety disorder: Secondary | ICD-10-CM

## 2019-07-10 DIAGNOSIS — E782 Mixed hyperlipidemia: Secondary | ICD-10-CM

## 2019-07-10 DIAGNOSIS — J324 Chronic pansinusitis: Secondary | ICD-10-CM

## 2019-07-10 MED ORDER — FLUTICASONE PROPIONATE 50 MCG/ACT NA SUSP: 2.0000 | Freq: Every day | NASAL | 6 refills | Status: DC

## 2019-07-10 MED ORDER — ALPRAZOLAM 0.5 MG PO TABS: 0.5000 mg | ORAL_TABLET | Freq: Every evening | ORAL | 3 refills | Status: DC | PRN

## 2019-07-10 NOTE — Progress Notes (Signed)
[facility] [place], [place] [address]  Internal MEDICINE  Telephone Visit  Patient Name: Alison Oliver  [id number]  [id number]  Date of Service: 07/10/2019  I connected with the patient at 8:48am by webcam and verified the patients identity using two identifiers.   I discussed the limitations, risks, security and privacy concerns of performing an evaluation and management service bywebcam and the availability of in person appointments. I also discussed with the patient that there may be a patient responsible charge related to the service.  The patient expressed understanding and agrees to proceed.    Chief Complaint  Patient presents with  . Telephone Assessment  . Telephone Screen  . Hyperlipidemia  . Osteoporosis  . Medication Refill    xanax and flonase    The patient has been contacted via webcam for follow up visit due to concerns for spread of novel coronavirus. The patient states that she is doing well overall. Does have some issues dealing with anxiety/depression related to spread of COVID 19. Her mother is in assisted living facility and has not been able to see her since March, 2020. She does take alprazolam 0.5mg  on as needed basis. Will generally just ake 1/2 tablet at bedtime if needed. She needs new prescription for this today. She is also due to have routine, fasting labs done.       Current Medication: Outpatient Encounter Medications as of 07/10/2019  Medication Sig  . ALPRAZolam (XANAX) 0.5 MG tablet Take 1 tablet (0.5 mg total) by mouth at bedtime as needed for anxiety.  Marland Kitchen desoximetasone (TOPICORT) 0.25 % cream Apply 1 application topically 2 (two) times daily.  . fluticasone (FLONASE) 50 MCG/ACT nasal spray Place 2 sprays into both nostrils daily.  . simvastatin (ZOCOR) 20 MG tablet Take 1 tablet (20 mg total) by mouth at bedtime.  . triamcinolone (KENALOG) 0.025 % cream Apply 1 application topically 2 (two) times daily.  .  [DISCONTINUED] ALPRAZolam (XANAX) 0.5 MG tablet Take 1 tablet (0.5 mg total) by mouth at bedtime as needed for anxiety.  . [DISCONTINUED] fluticasone (FLONASE) 50 MCG/ACT nasal spray Place 2 sprays into both nostrils daily.  Marland Kitchen linaclotide (LINZESS) 72 MCG capsule Take 1 capsule (72 mcg total) by mouth daily before breakfast.  . [DISCONTINUED] fluconazole (DIFLUCAN) 150 MG tablet Take 1 tablet po once. May repeat dose in 3 days as needed for persistent symptoms. (Patient not taking: Reported on 07/10/2019)  . [DISCONTINUED] trimethoprim-polymyxin b (POLYTRIM) ophthalmic solution Place 1 drop into both eyes every 4 (four) hours. (Patient not taking: Reported on 07/10/2019)   No facility-administered encounter medications on file as of 07/10/2019.     Surgical History: Past Surgical History:  Procedure Laterality Date  . CESAREAN SECTION      Medical History: Past Medical History:  Diagnosis Date  . Hyperlipidemia   . Osteoporosis     Family History: Family History  Problem Relation Age of Onset  . Breast cancer Maternal Aunt 52  . Breast cancer Maternal Grandmother   . Bladder Cancer Father   . Prostate cancer Brother     Social History   Socioeconomic History  . Marital status: Married    Spouse name: Not on file  . Number of children: Not on file  . Years of education: Not on file  . Highest education level: Not on file  Occupational History  . Not on file  Social Needs  . Financial resource strain: Not on file  . Food  insecurity    Worry: Not on file    Inability: Not on file  . Transportation needs    Medical: Not on file    Non-medical: Not on file  Tobacco Use  . Smoking status: Never Smoker  . Smokeless tobacco: Never Used  Substance and Sexual Activity  . Alcohol use: Yes    Comment: social   . Drug use: Never  . Sexual activity: Not on file  Lifestyle  . Physical activity    Days per week: Not on file    Minutes per session: Not on file  . Stress: Not  on file  Relationships  . Social Herbalist on phone: Not on file    Gets together: Not on file    Attends religious service: Not on file    Active member of club or organization: Not on file    Attends meetings of clubs or organizations: Not on file    Relationship status: Not on file  . Intimate partner violence    Fear of current or ex partner: Not on file    Emotionally abused: Not on file    Physically abused: Not on file    Forced sexual activity: Not on file  Other Topics Concern  . Not on file  Social History Narrative  . Not on file      Review of Systems  Constitutional: Negative for activity change, chills, fatigue and unexpected weight change.  HENT: Negative for congestion, postnasal drip, rhinorrhea, sinus pressure, sneezing and sore throat.   Respiratory: Negative for cough, chest tightness, shortness of breath and wheezing.   Cardiovascular: Negative for chest pain and palpitations.  Gastrointestinal: Negative for abdominal pain, constipation, diarrhea, nausea and vomiting.  Endocrine: Negative for cold intolerance, heat intolerance, polydipsia, polyphagia and polyuria.  Musculoskeletal: Negative for arthralgias, back pain, joint swelling and neck pain.  Skin: Negative for rash.  Allergic/Immunologic: Positive for environmental allergies.  Neurological: Positive for headaches. Negative for dizziness, tremors and numbness.  Hematological: Negative for adenopathy. Does not bruise/bleed easily.  Psychiatric/Behavioral: Positive for sleep disturbance. Negative for behavioral problems (Depression) and suicidal ideas. The patient is nervous/anxious.     Today's Vitals   07/10/19 0840  Weight: 114 lb (51.7 kg)  Height: 4\' 11"  (1.499 m)   Body mass index is 23.03 kg/m.  Observation/Objective:   The patient is alert and oriented. She is pleasant and answers all questions appropriately. Breathing is non-labored. She is in no acute distress at this time.     Assessment/Plan:  1. Mixed hyperlipidemia Check fasting labs prior to next visit. Adjust simvastatin dose as indicated.   2. Chronic pansinusitis - fluticasone (FLONASE) 50 MCG/ACT nasal spray; Place 2 sprays into both nostrils daily.  Dispense: 16 g; Refill: 6  3. Generalized anxiety disorder May take alprazolam 0.5mg  at bedtime as needed for anxiety/insomnia. Refills sent to her pharmacy. - ALPRAZolam (XANAX) 0.5 MG tablet; Take 1 tablet (0.5 mg total) by mouth at bedtime as needed for anxiety.  Dispense: 30 tablet; Refill: 3  General Counseling: Charne verbalizes understanding of the findings of today's phone visit and agrees with plan of treatment. I have discussed any further diagnostic evaluation that may be needed or ordered today. We also reviewed her medications today. she has been encouraged to call the office with any questions or concerns that should arise related to todays visit.  This patient was seen by Leretha Pol FNP Collaboration with Dr Lavera Guise as a part  of collaborative care agreement  Meds ordered this encounter  Medications  . fluticasone (FLONASE) 50 MCG/ACT nasal spray    Sig: Place 2 sprays into both nostrils daily.    Dispense:  16 g    Refill:  6    Order Specific Question:   Supervising Provider    Answer:   Lavera Guise [[id number]]  . ALPRAZolam (XANAX) 0.5 MG tablet    Sig: Take 1 tablet (0.5 mg total) by mouth at bedtime as needed for anxiety.    Dispense:  30 tablet    Refill:  3    Order Specific Question:   Supervising Provider    Answer:   Lavera Guise [[id number]]    Time spent: 51 Minutes    Dr Lavera Guise Internal medicine

## 2019-08-06 ENCOUNTER — Other Ambulatory Visit: Payer: Self-pay | Admitting: Nurse Practitioner

## 2019-08-11 ENCOUNTER — Other Ambulatory Visit: Payer: Self-pay | Admitting: Nurse Practitioner

## 2019-08-11 DIAGNOSIS — Z1231 Encounter for screening mammogram for malignant neoplasm of breast: Secondary | ICD-10-CM

## 2019-09-22 ENCOUNTER — Ambulatory Visit
Admission: RE | Admit: 2019-09-22 | Discharge: 2019-09-22 | Disposition: A | Discharge status: Home / Self Care | Payer: Commercial Managed Care - PPO | Source: Ambulatory Visit | Attending: Nurse Practitioner | Admitting: Nurse Practitioner

## 2019-09-22 DIAGNOSIS — Z1231 Encounter for screening mammogram for malignant neoplasm of breast: Secondary | ICD-10-CM | POA: Insufficient documentation

## 2019-09-27 NOTE — Progress Notes (Signed)
Negative mammogram

## 2019-10-06 ENCOUNTER — Telehealth: Payer: Self-pay

## 2019-10-06 NOTE — Telephone Encounter (Signed)
CONFIRMED APPOINTMENT WITH PATIENT FOR 10-08-19.

## 2019-10-08 ENCOUNTER — Ambulatory Visit (INDEPENDENT_AMBULATORY_CARE_PROVIDER_SITE_OTHER): Payer: Commercial Managed Care - PPO | Admitting: Nurse Practitioner

## 2019-10-08 ENCOUNTER — Other Ambulatory Visit: Payer: Self-pay

## 2019-10-08 ENCOUNTER — Encounter: Payer: Self-pay | Admitting: Nurse Practitioner

## 2019-10-08 VITALS — BP 112/65 | HR 65 | Temp 97.8°F | Resp 16 | Ht 59.0 in | Wt 115.0 lb

## 2019-10-08 DIAGNOSIS — Z0001 Encounter for general adult medical examination with abnormal findings: Secondary | ICD-10-CM | POA: Diagnosis not present

## 2019-10-08 DIAGNOSIS — Z124 Encounter for screening for malignant neoplasm of cervix: Secondary | ICD-10-CM | POA: Diagnosis not present

## 2019-10-08 DIAGNOSIS — Z79899 Other long term (current) drug therapy: Secondary | ICD-10-CM | POA: Diagnosis not present

## 2019-10-08 DIAGNOSIS — E782 Mixed hyperlipidemia: Secondary | ICD-10-CM

## 2019-10-08 DIAGNOSIS — F411 Generalized anxiety disorder: Secondary | ICD-10-CM | POA: Diagnosis not present

## 2019-10-08 DIAGNOSIS — R300 Dysuria: Secondary | ICD-10-CM

## 2019-10-08 LAB — POCT URINE DRUG SCREEN
POC Amphetamine UR: NOT DETECTED
POC BENZODIAZEPINES UR: NOT DETECTED
POC Barbiturate UR: NOT DETECTED
POC Cocaine UR: NOT DETECTED
POC Ecstasy UR: NOT DETECTED
POC Marijuana UR: NOT DETECTED
POC Methadone UR: NOT DETECTED
POC Methamphetamine UR: NOT DETECTED
POC Opiate Ur: NOT DETECTED
POC Oxycodone UR: NOT DETECTED
POC PHENCYCLIDINE UR: NOT DETECTED
POC TRICYCLICS UR: NOT DETECTED

## 2019-10-08 MED ORDER — ALPRAZOLAM 0.5 MG PO TABS: 0.5000 mg | ORAL_TABLET | Freq: Every evening | ORAL | 3 refills | Status: DC | PRN

## 2019-10-08 NOTE — Progress Notes (Signed)
[facility] [place], [place] [address]  Internal MEDICINE  Office Visit Note  Patient Name: Alison Oliver  [id number]  [id number]  Date of Service: 10/08/2019   Pt is here for routine health maintenance examination  Chief Complaint  Patient presents with  . Annual Exam  . Gynecologic Exam  . Hyperlipidemia  . Osteoporosis  . Insomnia    prefers not to take any medication     The patient is here for health maintenance exam and pap smear. She has been having a little trouble with sleeping. This got worse when her mother got sick and passed away from [place] 18 last month. She has taken her alprazolam a little more often, but does not take it every night. She is due to have routine, fasting blood work donw. Had mammogram 10/27/020 and it was negative. She did have Cologuard testing done 08/2018 and it was negative.     Current Medication: Outpatient Encounter Medications as of 10/08/2019  Medication Sig  . ALPRAZolam (XANAX) 0.5 MG tablet Take 1 tablet (0.5 mg total) by mouth at bedtime as needed for anxiety.  Marland Kitchen desoximetasone (TOPICORT) 0.25 % cream Apply 1 application topically 2 (two) times daily.  . fluticasone (FLONASE) 50 MCG/ACT nasal spray Place 2 sprays into both nostrils daily.  Marland Kitchen linaclotide (LINZESS) 72 MCG capsule Take 1 capsule (72 mcg total) by mouth daily before breakfast.  . simvastatin (ZOCOR) 20 MG tablet Take 1 tablet (20 mg total) by mouth at bedtime.  . triamcinolone (KENALOG) 0.025 % cream Apply 1 application topically 2 (two) times daily.  . [DISCONTINUED] ALPRAZolam (XANAX) 0.5 MG tablet Take 1 tablet (0.5 mg total) by mouth at bedtime as needed for anxiety.   No facility-administered encounter medications on file as of 10/08/2019.     Surgical History: Past Surgical History:  Procedure Laterality Date  . CESAREAN SECTION      Medical History: Past Medical History:  Diagnosis Date  . Hyperlipidemia   . Osteoporosis      Family History: Family History  Problem Relation Age of Onset  . Breast cancer Maternal Aunt 59  . Breast cancer Maternal Grandmother   . Bladder Cancer Father   . Prostate cancer Brother       Review of Systems  Constitutional: Negative for activity change, chills, fatigue and unexpected weight change.  HENT: Negative for congestion, postnasal drip, rhinorrhea, sneezing and sore throat.   Respiratory: Negative for cough, chest tightness, shortness of breath and wheezing.   Cardiovascular: Negative for chest pain and palpitations.  Gastrointestinal: Negative for abdominal pain, constipation, diarrhea, nausea and vomiting.  Endocrine: Negative for cold intolerance, heat intolerance, polydipsia and polyuria.  Genitourinary: Negative for dysuria, frequency, pelvic pain and urgency.  Musculoskeletal: Negative for arthralgias, back pain, joint swelling and neck pain.  Skin: Negative for rash.  Allergic/Immunologic: Negative for environmental allergies.  Neurological: Negative for dizziness, tremors, numbness and headaches.  Hematological: Negative for adenopathy. Does not bruise/bleed easily.  Psychiatric/Behavioral: Negative for behavioral problems (Depression), sleep disturbance and suicidal ideas. The patient is nervous/anxious.        The patient does have increased anxiety since her mother passed away. Has some trouble sleeping and takes prn xanax at night, generally half tablets, to help her sleep better.      Today's Vitals   10/08/19 0835  BP: 112/65  Pulse: 65  Resp: 16  Temp: 97.8 F (36.6 C)  SpO2: 98%  Weight: 115 lb (52.2 kg)  Height: 4\' 11"  (1.499 m)   Body mass index is 23.23 kg/m.  Physical Exam Vitals signs and nursing note reviewed.  Constitutional:      General: She is not in acute distress.    Appearance: Normal appearance. She is well-developed. She is not diaphoretic.  HENT:     Head: Normocephalic and atraumatic.     Nose: Nose normal.      Mouth/Throat:     Pharynx: No oropharyngeal exudate.  Eyes:     Extraocular Movements: Extraocular movements intact.     Pupils: Pupils are equal, round, and reactive to light.  Neck:     Musculoskeletal: Normal range of motion and neck supple.     Thyroid: No thyromegaly.     Vascular: No carotid bruit or JVD.     Trachea: No tracheal deviation.  Cardiovascular:     Rate and Rhythm: Normal rate and regular rhythm.     Pulses: Normal pulses.     Heart sounds: Normal heart sounds. No murmur. No friction rub. No gallop.   Pulmonary:     Effort: Pulmonary effort is normal. No respiratory distress.     Breath sounds: Normal breath sounds. No wheezing or rales.  Chest:     Chest wall: No tenderness.     Breasts:        Right: Normal. No swelling, bleeding, inverted nipple, mass, nipple discharge, skin change or tenderness.        Left: Normal. No swelling, bleeding, inverted nipple, mass, nipple discharge, skin change or tenderness.  Abdominal:     General: Bowel sounds are normal.     Palpations: Abdomen is soft.     Tenderness: There is no abdominal tenderness.  Genitourinary:    General: Normal vulva.     Exam position: Supine.     Labia:        Right: No rash or tenderness.        Left: No rash or tenderness.      Vagina: Normal. No vaginal discharge, erythema or tenderness.     Cervix: No cervical motion tenderness, discharge or erythema.     Uterus: Normal.      Adnexa: Right adnexa normal and left adnexa normal.     Comments: No tenderness, masses, or organomeglay present during bimanual exam .small amount of blood noted on cervical swab.  Musculoskeletal: Normal range of motion.  Lymphadenopathy:     Cervical: No cervical adenopathy.     Lower Body: No right inguinal adenopathy.  Skin:    General: Skin is warm and dry.  Neurological:     Mental Status: She is alert and oriented to person, place, and time.     Cranial Nerves: No cranial nerve deficit.  Psychiatric:         Mood and Affect: Mood normal.        Behavior: Behavior normal.        Thought Content: Thought content normal.        Judgment: Judgment normal.    LABS: Recent Results (from the past 2160 hour(s))  POCT Urine Drug Screen     Status: None   Collection Time: 10/08/19  8:46 AM  Result Value Ref Range   POC METHAMPHETAMINE UR None Detected None Detected   POC Opiate Ur None Detected None Detected   POC Barbiturate UR None Detected None Detected   POC Amphetamine UR None Detected None Detected   POC Oxycodone UR None Detected None Detected   POC Cocaine  UR None Detected None Detected   POC Ecstasy UR None Detected None Detected   POC TRICYCLICS UR None Detected None Detected   POC PHENCYCLIDINE UR None Detected None Detected   POC MARIJUANA UR None Detected None Detected   POC METHADONE UR None Detected None Detected   POC BENZODIAZEPINES UR None Detected None Detected   URINE TEMPERATURE     POC DRUG SCREEN OXIDANTS URINE     POC SPECIFIC GRAVITY URINE     POC PH URINE     Methylenedioxyamphetamine      Assessment/Plan:  1. Encounter for general adult medical examination with abnormal findings Annual health maintenance exam and pap smear done today.   2. Mixed hyperlipidemia Lab slip given to check routine, fasting labs. Adjust statin dosing as indicated.   3. Generalized anxiety disorder May take alprazolam 0.5mg  at bedtime as needed for anxiety/insomnia. New prescription sent to her pharmacy.  - ALPRAZolam (XANAX) 0.5 MG tablet; Take 1 tablet (0.5 mg total) by mouth at bedtime as needed for anxiety.  Dispense: 30 tablet; Refill: 3  4. Routine cervical smear - Pap IG and HPV (high risk) DNA detection  5. Encounter for long-term (current) use of medications - POCT Urine Drug Screen negative for all controlled medications. Appropriate as she takes prescribed alprazolam intermittently.   6. Dysuria - UA/M w/rflx Culture, Routine  General Counseling: Devone verbalizes  understanding of the findings of todays visit and agrees with plan of treatment. I have discussed any further diagnostic evaluation that may be needed or ordered today. We also reviewed her medications today. she has been encouraged to call the office with any questions or concerns that should arise related to todays visit.    Counseling:  This patient was seen by Leretha Pol FNP Collaboration with Dr Lavera Guise as a part of collaborative care agreement  Orders Placed This Encounter  Procedures  . UA/M w/rflx Culture, Routine  . POCT Urine Drug Screen    Meds ordered this encounter  Medications  . ALPRAZolam (XANAX) 0.5 MG tablet    Sig: Take 1 tablet (0.5 mg total) by mouth at bedtime as needed for anxiety.    Dispense:  30 tablet    Refill:  3    Order Specific Question:   Supervising Provider    Answer:   Lavera Guise [id number]    Time spent: [place], MD  Internal Medicine

## 2019-10-09 LAB — UA/M W/RFLX CULTURE, ROUTINE
Bilirubin, UA: NEGATIVE
Glucose, UA: NEGATIVE
Ketones, UA: NEGATIVE
Leukocytes,UA: NEGATIVE
Nitrite, UA: NEGATIVE
Protein,UA: NEGATIVE
RBC, UA: NEGATIVE
Specific Gravity, UA: 1.013 (ref 1.005–1.030)
Urobilinogen, Ur: 0.2 mg/dL (ref 0.2–1.0)
pH, UA: 6.5 (ref 5.0–7.5)

## 2019-10-09 LAB — MICROSCOPIC EXAMINATION: Casts: NONE SEEN /lpf

## 2019-10-14 ENCOUNTER — Telehealth: Payer: Self-pay | Admitting: Nurse Practitioner

## 2019-10-14 ENCOUNTER — Other Ambulatory Visit: Payer: Self-pay | Admitting: Nurse Practitioner

## 2019-10-14 DIAGNOSIS — J0140 Acute pansinusitis, unspecified: Secondary | ICD-10-CM

## 2019-10-14 MED ORDER — AZITHROMYCIN 250 MG PO TABS: ORAL_TABLET | ORAL | 0 refills | Status: DC

## 2019-10-14 NOTE — Telephone Encounter (Signed)
Patient has been advised of medications.

## 2019-10-14 NOTE — Progress Notes (Signed)
Sent patient prescription for z-pack - take as directed for 5 days. Sent to [place]. Use flonase as prescribed. Use OTC medications as needed and as indicated. Rest and increase fluids.

## 2019-10-14 NOTE — Telephone Encounter (Signed)
Sent patient prescription for z-pack - take as directed for 5 days. Sent to [place]. Use flonase as prescribed. Use OTC medications as needed and as indicated. Rest and increase fluids.

## 2019-10-15 LAB — PAP IG AND HPV HIGH-RISK: HPV, high-risk: NEGATIVE

## 2019-10-16 ENCOUNTER — Telehealth: Payer: Self-pay

## 2019-10-16 ENCOUNTER — Other Ambulatory Visit: Payer: Self-pay | Admitting: Nurse Practitioner

## 2019-10-16 DIAGNOSIS — J0140 Acute pansinusitis, unspecified: Secondary | ICD-10-CM

## 2019-10-16 MED ORDER — SULFAMETHOXAZOLE-TRIMETHOPRIM 800-160 MG PO TABS: 1.0000 | ORAL_TABLET | Freq: Two times a day (BID) | ORAL | 0 refills | Status: DC

## 2019-10-16 NOTE — Telephone Encounter (Signed)
lmom to pt that stopped zpak we [facility] bactrim [facility]

## 2019-10-16 NOTE — Progress Notes (Signed)
Pt advised pap is normal

## 2019-10-16 NOTE — Telephone Encounter (Signed)
z-pack ineffective for sinusitis. Sent bactrim DS bid for 10 days to [facility]. continue to use OTC medications as needed and as indicated for symptoms.

## 2019-10-16 NOTE — Progress Notes (Signed)
z-pack ineffective for sinusitis. Sent bactrim DS bid for 10 days to [facility]. continue to use OTC medications as needed and as indicated for symptoms.

## 2019-11-16 ENCOUNTER — Other Ambulatory Visit: Payer: Self-pay

## 2019-11-16 DIAGNOSIS — L209 Atopic dermatitis, unspecified: Secondary | ICD-10-CM

## 2019-11-16 MED ORDER — DESOXIMETASONE 0.25 % EX CREA: 1.0000 "application " | TOPICAL_CREAM | Freq: Two times a day (BID) | CUTANEOUS | 0 refills | Status: DC

## 2019-11-17 ENCOUNTER — Other Ambulatory Visit: Payer: Self-pay

## 2019-11-17 DIAGNOSIS — L209 Atopic dermatitis, unspecified: Secondary | ICD-10-CM

## 2019-11-17 MED ORDER — DESOXIMETASONE 0.25 % EX CREA: 1.0000 "application " | TOPICAL_CREAM | Freq: Two times a day (BID) | CUTANEOUS | 1 refills | Status: DC

## 2019-12-08 ENCOUNTER — Other Ambulatory Visit: Payer: Self-pay | Admitting: Nurse Practitioner

## 2019-12-09 LAB — LIPID PANEL W/O CHOL/HDL RATIO
Cholesterol, Total: 176 mg/dL (ref 100–199)
HDL: 39 mg/dL — ABNORMAL LOW (ref >39)
LDL Chol Calc (NIH): 109 mg/dL — ABNORMAL HIGH (ref 0–99)
Triglycerides: 160 mg/dL — ABNORMAL HIGH (ref 0–149)
VLDL Cholesterol Cal: 28 mg/dL (ref 5–40)

## 2019-12-09 LAB — COMPREHENSIVE METABOLIC PANEL
ALT: 11 IU/L (ref 0–32)
AST: 17 IU/L (ref 0–40)
Albumin/Globulin Ratio: 1.8 (ref 1.2–2.2)
Albumin: 4.4 g/dL (ref 3.8–4.9)
Alkaline Phosphatase: 106 IU/L (ref 39–117)
BUN/Creatinine Ratio: 24 — ABNORMAL HIGH (ref 9–23)
BUN: 14 mg/dL (ref 6–24)
Bilirubin Total: 0.6 mg/dL (ref 0.0–1.2)
CO2: 24 mmol/L (ref 20–29)
Calcium: 9.4 mg/dL (ref 8.7–10.2)
Chloride: 102 mmol/L (ref 96–106)
Creatinine, Ser: 0.58 mg/dL (ref 0.57–1.00)
GFR calc Af Amer: 117 mL/min/{1.73_m2} (ref >59)
GFR calc non Af Amer: 101 mL/min/{1.73_m2} (ref >59)
Globulin, Total: 2.4 g/dL (ref 1.5–4.5)
Glucose: 81 mg/dL (ref 65–99)
Potassium: 4.1 mmol/L (ref 3.5–5.2)
Sodium: 142 mmol/L (ref 134–144)
Total Protein: 6.8 g/dL (ref 6.0–8.5)

## 2019-12-09 LAB — CBC
Hematocrit: 40.4 % (ref 34.0–46.6)
Hemoglobin: 14 g/dL (ref 11.1–15.9)
MCH: 29.7 pg (ref 26.6–33.0)
MCHC: 34.7 g/dL (ref 31.5–35.7)
MCV: 86 fL (ref 79–97)
Platelets: 253 10*3/uL (ref 150–450)
RBC: 4.71 x10E6/uL (ref 3.77–5.28)
RDW: 12.7 % (ref 11.7–15.4)
WBC: 6.2 10*3/uL (ref 3.4–10.8)

## 2019-12-09 LAB — VITAMIN D 25 HYDROXY (VIT D DEFICIENCY, FRACTURES): Vit D, 25-Hydroxy: 45.1 ng/mL (ref 30.0–100.0)

## 2019-12-09 LAB — TSH: TSH: 2.13 u[IU]/mL (ref 0.450–4.500)

## 2019-12-09 LAB — T4, FREE: Free T4: 1.07 ng/dL (ref 0.82–1.77)

## 2019-12-22 NOTE — Progress Notes (Signed)
Please let the patient know that overall, her labs looked good. Cholesterol was just a little high. No changes to meds. Limit fried and fatty foods. Exercise routinely. Thanks.

## 2019-12-23 ENCOUNTER — Telehealth: Payer: Self-pay

## 2019-12-23 NOTE — Telephone Encounter (Signed)
PT WAS NOTIFIED. 

## 2019-12-23 NOTE — Telephone Encounter (Signed)
-----   Message from Ronnell Freshwater, NP sent at 12/22/2019  8:33 AM EST ----- Please let the patient know that overall, her labs looked good. Cholesterol was just a little high. No changes to meds. Limit fried and fatty foods. Exercise routinely. Thanks.

## 2020-02-01 ENCOUNTER — Other Ambulatory Visit: Payer: Self-pay

## 2020-02-01 DIAGNOSIS — E782 Mixed hyperlipidemia: Secondary | ICD-10-CM

## 2020-02-01 MED ORDER — SIMVASTATIN 20 MG PO TABS: 20.0000 mg | ORAL_TABLET | Freq: Every day | ORAL | 0 refills | Status: DC

## 2020-02-02 DIAGNOSIS — Z85828 Personal history of other malignant neoplasm of skin: Secondary | ICD-10-CM

## 2020-02-02 HISTORY — DX: Personal history of other malignant neoplasm of skin: Z85.828

## 2020-02-12 ENCOUNTER — Ambulatory Visit: Payer: Commercial Managed Care - PPO

## 2020-02-20 ENCOUNTER — Ambulatory Visit: Payer: Commercial Managed Care - PPO

## 2020-02-21 ENCOUNTER — Ambulatory Visit: Payer: Commercial Managed Care - PPO | Attending: Internal Medicine

## 2020-02-21 DIAGNOSIS — Z23 Encounter for immunization: Secondary | ICD-10-CM

## 2020-02-21 NOTE — Progress Notes (Signed)
   Covid-19 Vaccination Clinic  Name:  YU MCNISH    MRN: [id number] DOB: Nov 29, 1960  02/21/2020  Ms. Grondahl was observed post Covid-19 immunization for 15 minutes without incident. She was provided with Vaccine Information Sheet and instruction to access the V-Safe system.   Ms. Fussell was instructed to call 911 with any severe reactions post vaccine: Marland Kitchen Difficulty breathing  . Swelling of face and throat  . A fast heartbeat  . A bad rash all over body  . Dizziness and weakness   Immunizations Administered    Name Date Dose VIS Date Route   Pfizer COVID-19 Vaccine 02/21/2020 12:12 PM 0.3 mL 11/06/2019 Intramuscular   Manufacturer: [place]   Lot: [id number]   [place]: [id number]

## 2020-03-03 ENCOUNTER — Ambulatory Visit: Payer: Commercial Managed Care - PPO | Admitting: Dermatology

## 2020-03-07 ENCOUNTER — Other Ambulatory Visit: Payer: Self-pay

## 2020-03-07 ENCOUNTER — Ambulatory Visit: Payer: Commercial Managed Care - PPO | Admitting: Dermatology

## 2020-03-07 DIAGNOSIS — D229 Melanocytic nevi, unspecified: Secondary | ICD-10-CM

## 2020-03-07 DIAGNOSIS — D2239 Melanocytic nevi of other parts of face: Secondary | ICD-10-CM

## 2020-03-07 DIAGNOSIS — C4401 Basal cell carcinoma of skin of lip: Secondary | ICD-10-CM | POA: Diagnosis not present

## 2020-03-07 NOTE — Progress Notes (Signed)
   Follow-Up Visit   Subjective  Alison Oliver is a 59 y.o. female who presents for the following: Basal Cell Carcinoma (wants to discuss treatment options for [facility] on left upper lip) and brown spot on her chin (has been there for years, worried about [facility] wants it looked at).   The following portions of the chart were reviewed this encounter and updated as appropriate: Tobacco  Allergies  Meds  Problems  Med Hx  Surg Hx  Fam Hx      Review of Systems: No other skin or systemic complaints.  Objective  Well appearing patient in no apparent distress; mood and affect are within normal limits.  A focused examination was performed including face. Relevant physical exam findings are noted in the Assessment and Plan.  Objective  Left Upper Lip infranasal: Pink pearly papule or plaque with arborizing vessels.   Objective  Mid Chin: Tan-brown colored symmetric macules and papules.   Assessment & Plan  Basal cell carcinoma (BCC) of upper lip Left Upper Lip infranasal Biopsy-proven Discussed treatment options in detail including electrodesiccation curettage, excision, Mohs micrographic surgery, topical treatment etc.  Patient opts for [facility].  She understands the scarring and recurrence risk.  Destruction of lesion Complexity: extensive   Destruction method: electrodesiccation and curettage   Informed consent: discussed and consent obtained   Timeout:  patient name, date of birth, surgical site, and procedure verified Procedure prep:  Patient was prepped and draped in usual sterile fashion Prep type:  Isopropyl alcohol Anesthesia: the lesion was anesthetized in a standard fashion   Anesthetic:  1% lidocaine w/ epinephrine 1-100,000 buffered w/ 8.4% NaHCO3 Curettage performed in three different directions: Yes   Electrodesiccation performed over the curetted area: Yes   Curettage cycles:  3 Lesion length (cm):  0.7 Lesion width (cm):  0.7 Margin per side (cm):  0.2 Final wound  size (cm):  1.1 Hemostasis achieved with:  pressure, aluminum chloride and electrodesiccation Outcome: patient tolerated procedure well with no complications   Post-procedure details: sterile dressing applied and wound care instructions given   Dressing type: bandage and petrolatum    Discussed treatment options 1. ED&C  2. Mohs' surgery Patient has chosen ED & C today  [place] Mid Chin  Benign-appearing.  Observation.  Call clinic for new or changing moles.  Recommend daily use of broad spectrum spf 30+ sunscreen to sun-exposed areas.    Return for return as scheduled in July.   Marene Lenz, CMA, am acting as scribe for Sarina Ser, MD . Documentation: I have reviewed the above documentation for accuracy and completeness, and I agree with the above.  Sarina Ser, MD

## 2020-03-07 NOTE — Patient Instructions (Signed)
Recommend daily broad spectrum sunscreen SPF 30+ to sun-exposed areas, reapply every 2 hours as needed. Call for new or changing lesions.  Electrodesiccation and Curettage ("Scrape and Burn") Wound Care Instructions  1. Leave the original bandage on for 24 hours if possible.  If the bandage becomes soaked or soiled before that time, it is OK to remove it and examine the wound.  A small amount of post-operative bleeding is normal.  If excessive bleeding occurs, remove the bandage, place gauze over the site and apply continuous pressure (no peeking) over the area for 30 minutes. If this does not work, please call our clinic as soon as possible or page your doctor if it is after hours.   2. Once a day, cleanse the wound with soap and water. It is fine to shower. If a thick crust develops you may use a Q-tip dipped into dilute hydrogen peroxide (mix 1:1 with water) to dissolve it.  Hydrogen peroxide can slow the healing process, so use it only as needed.    3. After washing, apply petroleum jelly (Vaseline) or an antibiotic ointment if your doctor prescribed one for you, followed by a bandage.    4. For best healing, the wound should be covered with a layer of ointment at all times. If you are not able to keep the area covered with a bandage to hold the ointment in place, this may mean re-applying the ointment several times a day.  Continue this wound care until the wound has healed and is no longer open. It may take several weeks for the wound to heal and close.  Itching and mild discomfort is normal during the healing process.  If you have any discomfort, you can take Tylenol (acetaminophen) or ibuprofen as directed on the bottle. (Please do not take these if you have an allergy to them or cannot take them for another reason).  Some redness, tenderness and white or yellow material in the wound is normal healing.  If the area becomes very sore and red, or develops a thick yellow-green material (pus), it  may be infected; please notify us.    Wound healing continues for up to one year following surgery. It is not unusual to experience pain in the scar from time to time during the interval.  If the pain becomes severe or the scar thickens, you should notify the office.    A slight amount of redness in a scar is expected for the first six months.  After six months, the redness will fade and the scar will soften and fade.  The color difference becomes less noticeable with time.  If there are any problems, return for a post-op surgery check at your earliest convenience.  To improve the appearance of the scar, you can use silicone scar gel, cream, or sheets (such as Mederma or Serica) every night for up to one year. These are available over the counter (without a prescription).  Please call our office at [phone] for any questions or concerns.  

## 2020-03-08 ENCOUNTER — Encounter: Payer: Self-pay | Admitting: Dermatology

## 2020-03-19 ENCOUNTER — Ambulatory Visit: Payer: Commercial Managed Care - PPO | Attending: Internal Medicine

## 2020-03-19 ENCOUNTER — Ambulatory Visit: Payer: Commercial Managed Care - PPO

## 2020-03-19 DIAGNOSIS — Z23 Encounter for immunization: Secondary | ICD-10-CM

## 2020-03-19 NOTE — Progress Notes (Signed)
   Covid-19 Vaccination Clinic  Name:  Alison Oliver    MRN: [id number] DOB: Nov 12, 1961  03/19/2020  [name] was observed post Covid-19 immunization for 15 minutes without incident. She was provided with Vaccine Information Sheet and instruction to access the V-Safe system.   Ms. Brush was instructed to call 911 with any severe reactions post vaccine: Marland Kitchen Difficulty breathing  . Swelling of face and throat  . A fast heartbeat  . A bad rash all over body  . Dizziness and weakness   Immunizations Administered    Name Date Dose VIS Date Route   Pfizer COVID-19 Vaccine 03/19/2020 12:29 PM 0.3 mL 01/20/2019 Intramuscular   Manufacturer: [place]   Lot: [id number]   [place]: [id number]

## 2020-04-05 ENCOUNTER — Telehealth: Payer: Self-pay

## 2020-04-05 NOTE — Telephone Encounter (Signed)
Confirmed and screened for 04-07-20 ov.

## 2020-04-07 ENCOUNTER — Ambulatory Visit: Payer: Commercial Managed Care - PPO | Admitting: Nurse Practitioner

## 2020-04-07 ENCOUNTER — Encounter: Payer: Self-pay | Admitting: Nurse Practitioner

## 2020-04-07 ENCOUNTER — Other Ambulatory Visit: Payer: Self-pay

## 2020-04-07 VITALS — BP 115/72 | HR 78 | Temp 97.5°F | Resp 16 | Ht 59.0 in | Wt 119.0 lb

## 2020-04-07 DIAGNOSIS — F5101 Primary insomnia: Secondary | ICD-10-CM | POA: Diagnosis not present

## 2020-04-07 DIAGNOSIS — E782 Mixed hyperlipidemia: Secondary | ICD-10-CM

## 2020-04-07 DIAGNOSIS — F411 Generalized anxiety disorder: Secondary | ICD-10-CM

## 2020-04-07 MED ORDER — ALPRAZOLAM 0.5 MG PO TABS: 0.5000 mg | ORAL_TABLET | Freq: Every evening | ORAL | 3 refills | Status: DC | PRN

## 2020-04-07 MED ORDER — TRAZODONE HCL 50 MG PO TABS: ORAL_TABLET | ORAL | 3 refills | Status: DC

## 2020-04-07 MED ORDER — SIMVASTATIN 20 MG PO TABS: 20.0000 mg | ORAL_TABLET | Freq: Every day | ORAL | 0 refills | Status: DC

## 2020-04-07 NOTE — Progress Notes (Signed)
[facility] [place], [place] [address]  Internal MEDICINE  Office Visit Note  Patient Name: Alison Oliver  [id number]  [id number]  Date of Service: 04/07/2020  Chief Complaint  Patient presents with  . Hyperlipidemia  . Osteoporosis  . Sleeping Problem    hard time staying asleep, does not want to take xanax every day     The patient is here for routine follow up. The patient is doing well, overall. Had labs done back In January. Her LDL and triglycerides were mildly elevated. Total cholesterol was normal and improved from her last check. She has had small weight gain since last seen, but due to COVID 19 restrictions, has not been working out as often as she used to. She states that she has been having trouble sleeping, mostly staying asleep. Does take alprazolam from time to time, but not every night. Does not want to make this habitual. She states that she has had some increased stress. Mother passed away earlier in the year due to [place] 89.       Current Medication: Outpatient Encounter Medications as of 04/07/2020  Medication Sig  . ALPRAZolam (XANAX) 0.5 MG tablet Take 1 tablet (0.5 mg total) by mouth at bedtime as needed for anxiety.  Marland Kitchen desoximetasone (TOPICORT) 0.25 % cream Apply 1 application topically 2 (two) times daily.  . fluticasone (FLONASE) 50 MCG/ACT nasal spray Place 2 sprays into both nostrils daily.  . mometasone (ELOCON) 0.1 % cream Apply  a small amount to affected area as directed  qd up to 5 days per week aa rash on fingers prn flares  . mupirocin ointment (BACTROBAN) 2 % Apply  a small amount to affected area once a day  qd to wound with dressing changes  . simvastatin (ZOCOR) 20 MG tablet Take 1 tablet (20 mg total) by mouth at bedtime.  . triamcinolone (KENALOG) 0.025 % cream Apply 1 application topically 2 (two) times daily.  . [DISCONTINUED] ALPRAZolam (XANAX) 0.5 MG tablet Take 1 tablet (0.5 mg total) by mouth at bedtime as  needed for anxiety.  . [DISCONTINUED] doxycycline (MONODOX) 100 MG capsule Take 100 mg by mouth 2 (two) times daily.  . [DISCONTINUED] simvastatin (ZOCOR) 20 MG tablet Take 1 tablet (20 mg total) by mouth at bedtime.  . traZODone (DESYREL) 50 MG tablet Take 1/2 to 1 tablet po QHS prn insomnia  . [DISCONTINUED] azithromycin (ZITHROMAX) 250 MG tablet z-pack - take as directed for 5 days for sinusitis (Patient not taking: Reported on 03/07/2020)  . [DISCONTINUED] linaclotide (LINZESS) 72 MCG capsule Take 1 capsule (72 mcg total) by mouth daily before breakfast. (Patient not taking: Reported on 03/07/2020)  . [DISCONTINUED] sulfamethoxazole-trimethoprim (BACTRIM DS) 800-160 MG tablet Take 1 tablet by mouth 2 (two) times daily. (Patient not taking: Reported on 03/07/2020)   No facility-administered encounter medications on file as of 04/07/2020.    Surgical History: Past Surgical History:  Procedure Laterality Date  . CESAREAN SECTION      Medical History: Past Medical History:  Diagnosis Date  . Basal cell carcinoma 02/13/2010   Left mid ant. thigh. Superficial.   . Hx of basal cell carcinoma 02/02/2020   Left upper lip infranasal. Nodular pattern  . Hyperlipidemia   . Osteoporosis     Family History: Family History  Problem Relation Age of Onset  . Breast cancer Maternal Aunt 33  . Breast cancer Maternal Grandmother   . Bladder Cancer Father   . Prostate cancer Brother  Social History   Socioeconomic History  . Marital status: Married    Spouse name: Not on file  . Number of children: Not on file  . Years of education: Not on file  . Highest education level: Not on file  Occupational History  . Not on file  Tobacco Use  . Smoking status: Never Smoker  . Smokeless tobacco: Never Used  Substance and Sexual Activity  . Alcohol use: Yes    Comment: social   . Drug use: Never  . Sexual activity: Not on file  Other Topics Concern  . Not on file  Social History Narrative   . Not on file   Social Determinants of Health   Financial Resource Strain:   . Difficulty of Paying Living Expenses:   Food Insecurity:   . Worried About Charity fundraiser in the Last Year:   . Arboriculturist in the Last Year:   Transportation Needs:   . Film/video editor (Medical):   Marland Kitchen Lack of Transportation (Non-Medical):   Physical Activity:   . Days of Exercise per Week:   . Minutes of Exercise per Session:   Stress:   . Feeling of Stress :   Social Connections:   . Frequency of Communication with Friends and Family:   . Frequency of Social Gatherings with Friends and Family:   . Attends Religious Services:   . Active Member of Clubs or Organizations:   . Attends Archivist Meetings:   Marland Kitchen Marital Status:   Intimate Partner Violence:   . Fear of Current or Ex-Partner:   . Emotionally Abused:   Marland Kitchen Physically Abused:   . Sexually Abused:       Review of Systems  Constitutional: Negative for activity change, chills, fatigue and unexpected weight change.  HENT: Negative for congestion, postnasal drip, rhinorrhea, sneezing and sore throat.   Respiratory: Negative for cough, chest tightness, shortness of breath and wheezing.   Cardiovascular: Negative for chest pain and palpitations.  Gastrointestinal: Negative for abdominal pain, constipation, diarrhea, nausea and vomiting.  Endocrine: Negative for cold intolerance, heat intolerance, polydipsia and polyuria.  Musculoskeletal: Negative for arthralgias, back pain, joint swelling and neck pain.  Skin: Negative for rash.  Allergic/Immunologic: Negative for environmental allergies.  Neurological: Negative for dizziness, tremors, numbness and headaches.  Hematological: Negative for adenopathy. Does not bruise/bleed easily.  Psychiatric/Behavioral: Positive for sleep disturbance. Negative for behavioral problems (Depression) and suicidal ideas. The patient is nervous/anxious.     Today's Vitals   04/07/20  0841  BP: 115/72  Pulse: 78  Resp: 16  Temp: (!) 97.5 F (36.4 C)  SpO2: 98%  Weight: 119 lb (54 kg)  Height: 4\' 11"  (1.499 m)   Body mass index is 24.04 kg/m.  Physical Exam Vitals and nursing note reviewed.  Constitutional:      General: She is not in acute distress.    Appearance: Normal appearance. She is well-developed. She is not diaphoretic.  HENT:     Head: Normocephalic and atraumatic.     Mouth/Throat:     Pharynx: No oropharyngeal exudate.  Eyes:     Pupils: Pupils are equal, round, and reactive to light.  Neck:     Thyroid: No thyromegaly.     Vascular: No carotid bruit or JVD.     Trachea: No tracheal deviation.  Cardiovascular:     Rate and Rhythm: Normal rate and regular rhythm.     Heart sounds: Normal heart sounds. No murmur.  No friction rub. No gallop.   Pulmonary:     Effort: Pulmonary effort is normal. No respiratory distress.     Breath sounds: Normal breath sounds. No wheezing or rales.  Chest:     Chest wall: No tenderness.  Abdominal:     Palpations: Abdomen is soft.  Musculoskeletal:        General: Normal range of motion.     Cervical back: Normal range of motion and neck supple.  Lymphadenopathy:     Cervical: No cervical adenopathy.  Skin:    General: Skin is warm and dry.  Neurological:     Mental Status: She is alert and oriented to person, place, and time.     Cranial Nerves: No cranial nerve deficit.  Psychiatric:        Mood and Affect: Mood normal.        Behavior: Behavior normal.        Thought Content: Thought content normal.        Judgment: Judgment normal.    Assessment/Plan: 1. Mixed hyperlipidemia  reviewed most recent labs with the patient. Mild elevation of LDL and triglycerides. Continue simvastatin as prescribed. Refills provided today.  - simvastatin (ZOCOR) 20 MG tablet; Take 1 tablet (20 mg total) by mouth at bedtime.  Dispense: 90 tablet; Refill: 0  2. Primary insomnia Add trazodone 50mg  - advised she take  1/2 to 1 tablet at bedtime as needed for insomnia  - traZODone (DESYREL) 50 MG tablet; Take 1/2 to 1 tablet po QHS prn insomnia  Dispense: 30 tablet; Refill: 3  3. Generalized anxiety disorder May continue to take alprazolam 0.5mg  at bedtime. Advised she alternate this with new prescription for trazodone.  - ALPRAZolam (XANAX) 0.5 MG tablet; Take 1 tablet (0.5 mg total) by mouth at bedtime as needed for anxiety.  Dispense: 30 tablet; Refill: 3  General Counseling: Winta verbalizes understanding of the findings of todays visit and agrees with plan of treatment. I have discussed any further diagnostic evaluation that may be needed or ordered today. We also reviewed her medications today. she has been encouraged to call the office with any questions or concerns that should arise related to todays visit.   This patient was seen by [place] with Dr Lavera Guise as a part of collaborative care agreement  Meds ordered this encounter  Medications  . traZODone (DESYREL) 50 MG tablet    Sig: Take 1/2 to 1 tablet po QHS prn insomnia    Dispense:  30 tablet    Refill:  3    Order Specific Question:   Supervising Provider    Answer:   Lavera Guise [place]  . ALPRAZolam (XANAX) 0.5 MG tablet    Sig: Take 1 tablet (0.5 mg total) by mouth at bedtime as needed for anxiety.    Dispense:  30 tablet    Refill:  3    Order Specific Question:   Supervising Provider    Answer:   Lavera Guise [id number]  . simvastatin (ZOCOR) 20 MG tablet    Sig: Take 1 tablet (20 mg total) by mouth at bedtime.    Dispense:  90 tablet    Refill:  0    Order Specific Question:   Supervising Provider    Answer:   Lavera Guise [id number]    Total time spent:25 Minutes Time spent includes review of chart, medications, test results, and follow up plan with the patient.  Dr Lavera Guise Internal medicine

## 2020-06-06 ENCOUNTER — Ambulatory Visit: Payer: Commercial Managed Care - PPO | Admitting: Dermatology

## 2020-07-14 ENCOUNTER — Other Ambulatory Visit: Payer: Self-pay | Admitting: Nurse Practitioner

## 2020-07-14 DIAGNOSIS — Z1231 Encounter for screening mammogram for malignant neoplasm of breast: Secondary | ICD-10-CM

## 2020-07-25 ENCOUNTER — Other Ambulatory Visit: Payer: Self-pay

## 2020-07-25 DIAGNOSIS — E782 Mixed hyperlipidemia: Secondary | ICD-10-CM

## 2020-07-25 MED ORDER — SIMVASTATIN 20 MG PO TABS: 20.0000 mg | ORAL_TABLET | Freq: Every day | ORAL | 0 refills | Status: DC

## 2020-08-05 ENCOUNTER — Other Ambulatory Visit: Payer: Self-pay

## 2020-08-05 DIAGNOSIS — E782 Mixed hyperlipidemia: Secondary | ICD-10-CM

## 2020-08-05 MED ORDER — SIMVASTATIN 20 MG PO TABS: 20.0000 mg | ORAL_TABLET | Freq: Every day | ORAL | 0 refills | Status: DC

## 2020-08-15 ENCOUNTER — Other Ambulatory Visit: Payer: Self-pay

## 2020-08-15 ENCOUNTER — Ambulatory Visit: Payer: Commercial Managed Care - PPO | Admitting: Dermatology

## 2020-08-15 DIAGNOSIS — L720 Epidermal cyst: Secondary | ICD-10-CM | POA: Diagnosis not present

## 2020-08-15 DIAGNOSIS — Z85828 Personal history of other malignant neoplasm of skin: Secondary | ICD-10-CM

## 2020-08-15 DIAGNOSIS — L578 Other skin changes due to chronic exposure to nonionizing radiation: Secondary | ICD-10-CM | POA: Diagnosis not present

## 2020-08-15 NOTE — Progress Notes (Signed)
   Follow-Up Visit   Subjective  Alison Oliver is a 59 y.o. female who presents for the following: Procedure (Scar). Patient has a history of BCC treated on the left upper lip infranasal in April. Patient would like to discuss treatment to improve scar.  She also has small white bumps on her upper eyelids she would like to have checked and possibly discuss treatment.  The following portions of the chart were reviewed this encounter and updated as appropriate:  Tobacco  Allergies  Meds  Problems  Med Hx  Surg Hx  Fam Hx     Review of Systems:  No other skin or systemic complaints except as noted in HPI or Assessment and Plan.  Objective  Well appearing patient in no apparent distress; mood and affect are within normal limits.  A focused examination was performed including face. Relevant physical exam findings are noted in the Assessment and Plan.  Objective  Left Upper Lip Infranasal: Healed EDC scar.  Objective  Bilateral Upper Eyelids: Small smooth white papules.    Assessment & Plan   Actinic Damage - diffuse scaly erythematous macules with underlying dyspigmentation - Recommend daily broad spectrum sunscreen SPF 30+ to sun-exposed areas, reapply every 2 hours as needed.  - Call for new or changing lesions.   History of basal cell carcinoma (BCC) Left Upper Lip Infranasal  No evidence of recurrence, but would recommend starting 5FU, Calcipotriene 0.005% Cream Apply to scar and surrounding skin BID x 1 week for a higher cure rate. Rx sent to [facility]. Risk redness.  Discussed laser treatment to improve scar once healed. Will have Sonia Baller discuss with patient. Sonia Baller reviewed scar treatment with laser today with the patient.  She may consider scheduling for treatment in the future after treatment with topical 5-FU  Milia Bilateral Upper Eyelids Don't recommend extraction due to small size and don't recommend retinoid due to location.  Will consider  extraction if lesions grow larger. Benign, observe.    Return as scheduled for TBSE.Marland Kitchen   Documentation: I have reviewed the above documentation for accuracy and completeness, and I agree with the above.  Sarina Ser, MD

## 2020-08-15 NOTE — Patient Instructions (Signed)
Instructions for [facility] Medications  One or more of your medications was sent to the [facility] mail order compounding pharmacy. You will receive an email from them and can purchase the medicine through that link. It will then be mailed to your home at the address you confirmed. If for any reason you do not receive an email from them, please check your spam folder. If you still do not find the email, please let us know. [facility] phone number is [phone].   

## 2020-08-16 ENCOUNTER — Encounter: Payer: Self-pay | Admitting: Dermatology

## 2020-09-22 ENCOUNTER — Other Ambulatory Visit: Payer: Self-pay

## 2020-09-22 ENCOUNTER — Ambulatory Visit
Admission: RE | Admit: 2020-09-22 | Discharge: 2020-09-22 | Disposition: A | Discharge status: Home / Self Care | Payer: Commercial Managed Care - PPO | Source: Ambulatory Visit | Attending: Nurse Practitioner | Admitting: Nurse Practitioner

## 2020-09-22 DIAGNOSIS — Z1231 Encounter for screening mammogram for malignant neoplasm of breast: Secondary | ICD-10-CM | POA: Insufficient documentation

## 2020-09-23 NOTE — Progress Notes (Signed)
Negative mammogram

## 2020-10-07 ENCOUNTER — Other Ambulatory Visit: Payer: Self-pay

## 2020-10-07 ENCOUNTER — Ambulatory Visit (INDEPENDENT_AMBULATORY_CARE_PROVIDER_SITE_OTHER): Payer: Commercial Managed Care - PPO | Admitting: Nurse Practitioner

## 2020-10-07 ENCOUNTER — Encounter: Payer: Self-pay | Admitting: Nurse Practitioner

## 2020-10-07 VITALS — BP 112/70 | HR 73 | Temp 97.9°F | Resp 16 | Ht 59.0 in | Wt 118.0 lb

## 2020-10-07 DIAGNOSIS — Z0001 Encounter for general adult medical examination with abnormal findings: Secondary | ICD-10-CM | POA: Diagnosis not present

## 2020-10-07 DIAGNOSIS — R300 Dysuria: Secondary | ICD-10-CM | POA: Diagnosis not present

## 2020-10-07 DIAGNOSIS — E782 Mixed hyperlipidemia: Secondary | ICD-10-CM

## 2020-10-07 DIAGNOSIS — F411 Generalized anxiety disorder: Secondary | ICD-10-CM | POA: Diagnosis not present

## 2020-10-07 MED ORDER — SIMVASTATIN 20 MG PO TABS: 20.0000 mg | ORAL_TABLET | Freq: Every day | ORAL | 3 refills | Status: DC

## 2020-10-07 MED ORDER — ALPRAZOLAM 0.5 MG PO TABS: 0.5000 mg | ORAL_TABLET | Freq: Every evening | ORAL | 3 refills | Status: DC | PRN

## 2020-10-07 NOTE — Progress Notes (Signed)
[facility] [place], [place] [address]  Internal MEDICINE  Office Visit Note  Patient Name: Alison Oliver  [id number]  [id number]  Date of Service: 10/30/2020   Pt is here for routine health maintenance examination   Chief Complaint  Patient presents with  . Annual Exam  . Hyperlipidemia  . Quality Metric Gaps    flu,Hep C  . controlled substance form    reviewed with PT     The patient is here for health maintenance exam. She is due to have routine, fasting labs. She declines flu shot today. She has had both of her COVID 19 vaccines. She did have some trouble with vision after the second vaccine. She is holding off on getting the booster vaccine at this time. She states that she routinely takes her cholesterol medication. She takes alprazolam very rarely to help her sleep some times. Her last fill was 06/2020 and refill for this prescription is expired at this time. She has no new concerns or complaints today.   Current Medication: Outpatient Encounter Medications as of 10/07/2020  Medication Sig Note  . ALPRAZolam (XANAX) 0.5 MG tablet Take 1 tablet (0.5 mg total) by mouth at bedtime as needed for anxiety.   Marland Kitchen desoximetasone (TOPICORT) 0.25 % cream Apply 1 application topically 2 (two) times daily.   . fluticasone (FLONASE) 50 MCG/ACT nasal spray Place 2 sprays into both nostrils daily.   . mometasone (ELOCON) 0.1 % cream Apply  a small amount to affected area as directed  qd up to 5 days per week aa rash on fingers prn flares   . mupirocin ointment (BACTROBAN) 2 % Apply  a small amount to affected area once a day  qd to wound with dressing changes   . simvastatin (ZOCOR) 20 MG tablet Take 1 tablet (20 mg total) by mouth at bedtime.   . triamcinolone (KENALOG) 0.025 % cream Apply 1 application topically 2 (two) times daily.   . [DISCONTINUED] ALPRAZolam (XANAX) 0.5 MG tablet Take 1 tablet (0.5 mg total) by mouth at bedtime as needed for anxiety.    . [DISCONTINUED] simvastatin (ZOCOR) 20 MG tablet Take 1 tablet (20 mg total) by mouth at bedtime.   . [DISCONTINUED] traZODone (DESYREL) 50 MG tablet Take 1/2 to 1 tablet po QHS prn insomnia 10/07/2020: patient never took this after reading side effects   No facility-administered encounter medications on file as of 10/07/2020.    Surgical History: Past Surgical History:  Procedure Laterality Date  . CATARACT EXTRACTION, BILATERAL Bilateral 09/21,10/21  . CESAREAN SECTION      Medical History: Past Medical History:  Diagnosis Date  . Basal cell carcinoma 02/13/2010   Left mid ant. thigh. Superficial.   . Hx of basal cell carcinoma 02/02/2020   Left upper lip infranasal. Nodular pattern  . Hyperlipidemia   . Osteoporosis     Family History: Family History  Problem Relation Age of Onset  . Breast cancer Maternal Aunt 61  . Breast cancer Maternal Grandmother   . Bladder Cancer Father   . Prostate cancer Brother       Review of Systems  Constitutional: Negative for activity change, chills, fatigue and unexpected weight change.  HENT: Negative for congestion, postnasal drip, rhinorrhea, sneezing and sore throat.   Respiratory: Negative for cough, chest tightness, shortness of breath and wheezing.   Cardiovascular: Negative for chest pain and palpitations.  Gastrointestinal: Negative for abdominal pain, constipation, diarrhea, nausea and vomiting.  Endocrine: Negative for  cold intolerance, heat intolerance, polydipsia and polyuria.  Genitourinary: Negative for dysuria.  Musculoskeletal: Negative for arthralgias, back pain, joint swelling and neck pain.  Skin: Negative for rash.  Allergic/Immunologic: Negative for environmental allergies.  Neurological: Negative for dizziness, tremors, numbness and headaches.  Hematological: Negative for adenopathy. Does not bruise/bleed easily.  Psychiatric/Behavioral: Positive for sleep disturbance. Negative for behavioral problems  (Depression) and suicidal ideas. The patient is nervous/anxious.      Today's Vitals   10/07/20 0837  BP: 112/70  Pulse: 73  Resp: 16  Temp: 97.9 F (36.6 C)  SpO2: 98%  Weight: 118 lb (53.5 kg)  Height: 4\' 11"  (1.499 m)   Body mass index is 23.83 kg/m.  Physical Exam Vitals and nursing note reviewed.  Constitutional:      General: She is not in acute distress.    Appearance: Normal appearance. She is well-developed. She is not diaphoretic.  HENT:     Head: Normocephalic and atraumatic.     Nose: Nose normal.     Mouth/Throat:     Pharynx: No oropharyngeal exudate.  Eyes:     Pupils: Pupils are equal, round, and reactive to light.  Neck:     Thyroid: No thyromegaly.     Vascular: No carotid bruit or JVD.     Trachea: No tracheal deviation.  Cardiovascular:     Rate and Rhythm: Normal rate and regular rhythm.     Pulses: Normal pulses.     Heart sounds: Normal heart sounds. No murmur heard.  No friction rub. No gallop.   Pulmonary:     Effort: Pulmonary effort is normal. No respiratory distress.     Breath sounds: Normal breath sounds. No wheezing or rales.  Chest:     Chest wall: No tenderness.     Breasts:        Right: Normal. No swelling, bleeding, inverted nipple, mass, nipple discharge, skin change or tenderness.        Left: Normal. No swelling, bleeding, inverted nipple, mass, nipple discharge, skin change or tenderness.  Abdominal:     General: Bowel sounds are normal.     Palpations: Abdomen is soft.     Tenderness: There is no abdominal tenderness.  Musculoskeletal:        General: Normal range of motion.     Cervical back: Normal range of motion and neck supple.  Lymphadenopathy:     Cervical: No cervical adenopathy.     Upper Body:     Right upper body: No axillary adenopathy.     Left upper body: No axillary adenopathy.  Skin:    General: Skin is warm and dry.     Capillary Refill: Capillary refill takes less than 2 seconds.  Neurological:      General: No focal deficit present.     Mental Status: She is alert and oriented to person, place, and time.     Cranial Nerves: No cranial nerve deficit.  Psychiatric:        Mood and Affect: Mood normal.        Behavior: Behavior normal.        Thought Content: Thought content normal.        Judgment: Judgment normal.      LABS: Recent Results (from the past 2160 hour(s))  UA/M w/rflx Culture, Routine     Status: None   Collection Time: 10/07/20  8:38 AM   Specimen: Urine   Urine  Result Value Ref Range   Specific Gravity,  UA 1.021 1.005 - 1.030   pH, UA 5.5 5.0 - 7.5   Color, UA Yellow Yellow   Appearance Ur Clear Clear   Leukocytes,UA Negative Negative   Protein,UA Negative Negative/Trace   Glucose, UA Negative Negative   Ketones, UA Negative Negative   RBC, UA Negative Negative   Bilirubin, UA Negative Negative   Urobilinogen, Ur 0.2 0.2 - 1.0 mg/dL   Nitrite, UA Negative Negative   Microscopic Examination Comment     Comment: Microscopic follows if indicated.   Microscopic Examination See below:     Comment: Microscopic was indicated and was performed.   Urinalysis Reflex Comment     Comment: This specimen will not reflex to a Urine Culture.  Microscopic Examination     Status: Abnormal   Collection Time: 10/07/20  8:38 AM   Urine  Result Value Ref Range   WBC, UA 0-5 0 - 5 /hpf   RBC 0-2 0 - 2 /hpf   Epithelial Cells (non renal) None seen 0 - 10 /hpf   Casts None seen None seen /lpf   Crystals Present (A) N/A   Crystal Type Calcium Oxalate N/A   Bacteria, UA None seen None seen/Few     Assessment/Plan: 1. Encounter for general adult medical examination with abnormal findings Annual health maintenance exam today. Order slip given to have routine, fasting labs done prior to her next visit.   2. Mixed hyperlipidemia Check fasting lipid panel and adjust simvastatin as indicated.  - simvastatin (ZOCOR) 20 MG tablet; Take 1 tablet (20 mg total) by mouth at  bedtime.  Dispense: 90 tablet; Refill: 3  3. Generalized anxiety disorder May take alprazolam 0.5mg  at bedtime as needed for anxiety/insomnia. New prescription sent to her pharmacy today.  - ALPRAZolam (XANAX) 0.5 MG tablet; Take 1 tablet (0.5 mg total) by mouth at bedtime as needed for anxiety.  Dispense: 30 tablet; Refill: 3  4. Dysuria - UA/M w/rflx Culture, Routine  General Counseling: Sonyia verbalizes understanding of the findings of todays visit and agrees with plan of treatment. I have discussed any further diagnostic evaluation that may be needed or ordered today. We also reviewed her medications today. she has been encouraged to call the office with any questions or concerns that should arise related to todays visit.    Counseling:  This patient was seen by Leretha Pol FNP Collaboration with Dr Lavera Guise as a part of collaborative care agreement  Orders Placed This Encounter  Procedures  . Microscopic Examination  . UA/M w/rflx Culture, Routine    Meds ordered this encounter  Medications  . ALPRAZolam (XANAX) 0.5 MG tablet    Sig: Take 1 tablet (0.5 mg total) by mouth at bedtime as needed for anxiety.    Dispense:  30 tablet    Refill:  3    Order Specific Question:   Supervising Provider    Answer:   Lavera Guise [[id number]]  . simvastatin (ZOCOR) 20 MG tablet    Sig: Take 1 tablet (20 mg total) by mouth at bedtime.    Dispense:  90 tablet    Refill:  3    Order Specific Question:   Supervising Provider    Answer:   Lavera Guise [[id number]]    Total time spent: 35 Minutes  Time spent includes review of chart, medications, test results, and follow up plan with the patient.     Lavera Guise, MD  Internal Medicine

## 2020-10-08 LAB — MICROSCOPIC EXAMINATION
Bacteria, UA: NONE SEEN
Casts: NONE SEEN /lpf
Epithelial Cells (non renal): NONE SEEN /hpf (ref 0–10)

## 2020-10-08 LAB — UA/M W/RFLX CULTURE, ROUTINE
Bilirubin, UA: NEGATIVE
Glucose, UA: NEGATIVE
Ketones, UA: NEGATIVE
Leukocytes,UA: NEGATIVE
Nitrite, UA: NEGATIVE
Protein,UA: NEGATIVE
RBC, UA: NEGATIVE
Specific Gravity, UA: 1.021 (ref 1.005–1.030)
Urobilinogen, Ur: 0.2 mg/dL (ref 0.2–1.0)
pH, UA: 5.5 (ref 5.0–7.5)

## 2020-11-14 ENCOUNTER — Telehealth: Payer: Self-pay

## 2020-11-14 ENCOUNTER — Other Ambulatory Visit: Payer: Self-pay

## 2020-11-14 MED ORDER — DOXYCYCLINE HYCLATE 100 MG PO CAPS: 100.0000 mg | ORAL_CAPSULE | Freq: Two times a day (BID) | ORAL | 0 refills | Status: DC

## 2020-11-14 NOTE — Telephone Encounter (Signed)
Pt advised that as per heather we send doxycyline if we are not feeling better need to been seen

## 2020-12-08 ENCOUNTER — Other Ambulatory Visit: Payer: Self-pay | Admitting: Nurse Practitioner

## 2020-12-09 LAB — COMPREHENSIVE METABOLIC PANEL
ALT: 18 IU/L (ref 0–32)
AST: 24 IU/L (ref 0–40)
Albumin/Globulin Ratio: 2 (ref 1.2–2.2)
Albumin: 4.7 g/dL (ref 3.8–4.9)
Alkaline Phosphatase: 116 IU/L (ref 44–121)
BUN/Creatinine Ratio: 19 (ref 12–28)
BUN: 13 mg/dL (ref 8–27)
Bilirubin Total: 0.7 mg/dL (ref 0.0–1.2)
CO2: 23 mmol/L (ref 20–29)
Calcium: 9.5 mg/dL (ref 8.7–10.3)
Chloride: 102 mmol/L (ref 96–106)
Creatinine, Ser: 0.68 mg/dL (ref 0.57–1.00)
GFR calc Af Amer: 110 mL/min/{1.73_m2} (ref >59)
GFR calc non Af Amer: 95 mL/min/{1.73_m2} (ref >59)
Globulin, Total: 2.3 g/dL (ref 1.5–4.5)
Glucose: 78 mg/dL (ref 65–99)
Potassium: 4.1 mmol/L (ref 3.5–5.2)
Sodium: 141 mmol/L (ref 134–144)
Total Protein: 7 g/dL (ref 6.0–8.5)

## 2020-12-09 LAB — HCV AB W REFLEX TO QUANT PCR: HCV Ab: <0.1 s/co ratio (ref 0.0–0.9)

## 2020-12-09 LAB — CBC
Hematocrit: 41 % (ref 34.0–46.6)
Hemoglobin: 13.8 g/dL (ref 11.1–15.9)
MCH: 29.4 pg (ref 26.6–33.0)
MCHC: 33.7 g/dL (ref 31.5–35.7)
MCV: 87 fL (ref 79–97)
Platelets: 273 10*3/uL (ref 150–450)
RBC: 4.7 x10E6/uL (ref 3.77–5.28)
RDW: 12.1 % (ref 11.7–15.4)
WBC: 5.8 10*3/uL (ref 3.4–10.8)

## 2020-12-09 LAB — VITAMIN D 25 HYDROXY (VIT D DEFICIENCY, FRACTURES): Vit D, 25-Hydroxy: 48.5 ng/mL (ref 30.0–100.0)

## 2020-12-09 LAB — HCV INTERPRETATION

## 2020-12-09 LAB — LIPID PANEL WITH LDL/HDL RATIO
Cholesterol, Total: 179 mg/dL (ref 100–199)
HDL: 39 mg/dL — ABNORMAL LOW (ref >39)
LDL Chol Calc (NIH): 108 mg/dL — ABNORMAL HIGH (ref 0–99)
LDL/HDL Ratio: 2.8 ratio (ref 0.0–3.2)
Triglycerides: 183 mg/dL — ABNORMAL HIGH (ref 0–149)
VLDL Cholesterol Cal: 32 mg/dL (ref 5–40)

## 2020-12-09 LAB — TSH: TSH: 2.02 u[IU]/mL (ref 0.450–4.500)

## 2020-12-09 LAB — T4, FREE: Free T4: 1.2 ng/dL (ref 0.82–1.77)

## 2020-12-15 ENCOUNTER — Ambulatory Visit: Payer: Commercial Managed Care - PPO | Admitting: Dermatology

## 2020-12-15 ENCOUNTER — Encounter: Payer: Commercial Managed Care - PPO | Admitting: Dermatology

## 2021-01-05 ENCOUNTER — Telehealth: Payer: Self-pay

## 2021-01-05 NOTE — Telephone Encounter (Signed)
Labs look within normal limits mildly abnormal lipid profile, can make changes in her diet, go ahead and mail her a low fat low chol diet please

## 2021-01-06 ENCOUNTER — Telehealth: Payer: Self-pay

## 2021-01-06 NOTE — Telephone Encounter (Signed)
Informed pt her lipid profile was mildly abnormal and let her know I would mail her the diet DFK recommended.

## 2021-04-03 ENCOUNTER — Ambulatory Visit: Payer: Commercial Managed Care - PPO | Admitting: Dermatology

## 2021-04-03 ENCOUNTER — Other Ambulatory Visit: Payer: Self-pay

## 2021-04-03 DIAGNOSIS — D1800 Hemangioma unspecified site: Secondary | ICD-10-CM

## 2021-04-03 DIAGNOSIS — L813 Cafe au lait spots: Secondary | ICD-10-CM

## 2021-04-03 DIAGNOSIS — L821 Other seborrheic keratosis: Secondary | ICD-10-CM

## 2021-04-03 DIAGNOSIS — Z1283 Encounter for screening for malignant neoplasm of skin: Secondary | ICD-10-CM | POA: Diagnosis not present

## 2021-04-03 DIAGNOSIS — B353 Tinea pedis: Secondary | ICD-10-CM

## 2021-04-03 DIAGNOSIS — Z85828 Personal history of other malignant neoplasm of skin: Secondary | ICD-10-CM | POA: Diagnosis not present

## 2021-04-03 DIAGNOSIS — D229 Melanocytic nevi, unspecified: Secondary | ICD-10-CM

## 2021-04-03 DIAGNOSIS — L578 Other skin changes due to chronic exposure to nonionizing radiation: Secondary | ICD-10-CM

## 2021-04-03 DIAGNOSIS — D2372 Other benign neoplasm of skin of left lower limb, including hip: Secondary | ICD-10-CM

## 2021-04-03 MED ORDER — KETOCONAZOLE 2 % EX CREA: 1.0000 "application " | TOPICAL_CREAM | Freq: Every day | CUTANEOUS | 4 refills | Status: AC

## 2021-04-03 NOTE — Patient Instructions (Addendum)
Recommend daily broad spectrum sunscreen SPF 30+ to sun-exposed areas, reapply every 2 hours as needed. Call for new or changing lesions.  Staying in the shade or wearing long sleeves, sun glasses (UVA+UVB protection) and wide brim hats (4-inch brim around the entire circumference of the hat) are also recommended for sun protection.   If you have any questions or concerns for your doctor, please call our main line at [phone] and press option 4 to reach your doctor's medical assistant. If no one answers, please leave a voicemail as directed and we will return your call as soon as possible. Messages left after 4 pm will be answered the following business day.   You may also send us a message via MyChart. We typically respond to MyChart messages within 1-2 business days.  For prescription refills, please ask your pharmacy to contact our office. Our fax number is [phone].  If you have an urgent issue when the clinic is closed that cannot wait until the next business day, you can page your doctor at the number below.    Please note that while we do our best to be available for urgent issues outside of office hours, we are not available 24/7.   If you have an urgent issue and are unable to reach us, you may choose to seek medical care at your doctor's office, retail clinic, urgent care center, or emergency room.  If you have a medical emergency, please immediately call 911 or go to the emergency department.  Pager Numbers  - Dr. Kowalski: [phone]  - Dr. Moye: [phone]  - Dr. Stewart: [phone]  In the event of inclement weather, please call our main line at [phone] for an update on the status of any delays or closures.  Dermatology Medication Tips: Please keep the boxes that topical medications come in in order to help keep track of the instructions about where and how to use these. Pharmacies typically print the medication instructions only on the boxes and not  directly on the medication tubes.   If your medication is too expensive, please contact our office at [phone] option 4 or send us a message through MyChart.   We are unable to tell what your co-pay for medications will be in advance as this is different depending on your insurance coverage. However, we may be able to find a substitute medication at lower cost or fill out paperwork to get insurance to cover a needed medication.   If a prior authorization is required to get your medication covered by your insurance company, please allow us 1-2 business days to complete this process.  Drug prices often vary depending on where the prescription is filled and some pharmacies may offer cheaper prices.  The website www.goodrx.com contains coupons for medications through different pharmacies. The prices here do not account for what the cost may be with help from insurance (it may be cheaper with your insurance), but the website can give you the price if you did not use any insurance.  - You can print the associated coupon and take it with your prescription to the pharmacy.  - You may also stop by our office during regular business hours and pick up a GoodRx coupon card.  - If you need your prescription sent electronically to a different pharmacy, notify our office through [facility] MyChart or by phone at [phone] option 4.  

## 2021-04-03 NOTE — Progress Notes (Signed)
Follow-Up Visit   Subjective  Alison Oliver is a 60 y.o. female who presents for the following: Annual Exam (Patient here today for 1 year tbse. She has history of bcc of left upper lip and left mid anterior thigh. She reports a rash on bottom of left foot she would like checked today. She states it itches and noticed a month ago. She has a spot on left lower leg she would like check and spot on chin. She denies other concerns at this time. ). Patient here for full body skin exam and skin cancer screening.  The following portions of the chart were reviewed this encounter and updated as appropriate:  Tobacco  Allergies  Meds  Problems  Med Hx  Surg Hx  Fam Hx      Objective  Well appearing patient in no apparent distress; mood and affect are within normal limits.  A full examination was performed including scalp, head, eyes, ears, nose, lips, neck, chest, axillae, abdomen, back, buttocks, bilateral upper extremities, bilateral lower extremities, hands, feet, fingers, toes, fingernails, and toenails. All findings within normal limits unless otherwise noted below.  Objective  bilateral feet: Scaling and maceration web spaces and over distal and lateral soles.   Assessment & Plan  Tinea pedis of both feet bilateral feet Chronic and persistent Ketoconazole use at bedtime for 2 month apply to entire areas of feet and in between toes  6 rfs  ketoconazole (NIZORAL) 2 % cream - bilateral feet  Skin cancer screening  Lentigines - Scattered tan macules - Due to sun exposure - Benign-appering, observe - Recommend daily broad spectrum sunscreen SPF 30+ to sun-exposed areas, reapply every 2 hours as needed. - Call for any changes  Dermatofibroma - Firm pink/brown papulenodule with dimple sign left lateral thigh - Benign appearing - Call for any changes  Seborrheic Keratoses - Stuck-on, waxy, tan-brown papules and/or plaques posterior left leg, back - Benign-appearing -  Discussed benign etiology and prognosis. - Observe - Call for any changes  Melanocytic Nevi - Tan-brown and/or pink-flesh-colored symmetric macules and papules on chin  - Benign appearing on exam today - Observation - Call clinic for new or changing moles - Recommend daily use of broad spectrum spf 30+ sunscreen to sun-exposed areas.   Hemangiomas - Red papules - Discussed benign nature - Observe - Call for any changes  Actinic Damage - Chronic condition, secondary to cumulative UV/sun exposure - diffuse scaly erythematous macules with underlying dyspigmentation - Recommend daily broad spectrum sunscreen SPF 30+ to sun-exposed areas, reapply every 2 hours as needed.  - Staying in the shade or wearing long sleeves, sun glasses (UVA+UVB protection) and wide brim hats (4-inch brim around the entire circumference of the hat) are also recommended for sun protection.  - Call for new or changing lesions.  History of Basal Cell Carcinoma of the Skin - No evidence of recurrence today on left upper lip (2021) and left med anterior thigh (2011) - Recommend regular full body skin exams - Recommend daily broad spectrum sunscreen SPF 30+ to sun-exposed areas, reapply every 2 hours as needed.  - Call if any new or changing lesions are noted between office visits  Skin cancer screening performed today.  Return in about 1 year (around 04/03/2022) for tbse.  IRuthell Rummage, CMA, am acting as scribe for Sarina Ser, MD.  Documentation: I have reviewed the above documentation for accuracy and completeness, and I agree with the above.  Sarina Ser, MD

## 2021-04-05 ENCOUNTER — Encounter: Payer: Self-pay | Admitting: Dermatology

## 2021-04-06 ENCOUNTER — Ambulatory Visit: Payer: Commercial Managed Care - PPO | Admitting: Nurse Practitioner

## 2021-05-15 ENCOUNTER — Ambulatory Visit: Payer: Commercial Managed Care - PPO | Admitting: Nurse Practitioner

## 2021-06-27 ENCOUNTER — Other Ambulatory Visit: Payer: Self-pay

## 2021-06-27 ENCOUNTER — Ambulatory Visit: Payer: Commercial Managed Care - PPO | Admitting: Nurse Practitioner

## 2021-06-27 ENCOUNTER — Encounter: Payer: Self-pay | Admitting: Nurse Practitioner

## 2021-06-27 VITALS — BP 110/72 | HR 65 | Temp 97.4°F | Resp 16 | Ht 59.0 in | Wt 117.0 lb

## 2021-06-27 DIAGNOSIS — J029 Acute pharyngitis, unspecified: Secondary | ICD-10-CM

## 2021-06-27 DIAGNOSIS — Z79899 Other long term (current) drug therapy: Secondary | ICD-10-CM

## 2021-06-27 DIAGNOSIS — J324 Chronic pansinusitis: Secondary | ICD-10-CM | POA: Diagnosis not present

## 2021-06-27 DIAGNOSIS — E782 Mixed hyperlipidemia: Secondary | ICD-10-CM | POA: Diagnosis not present

## 2021-06-27 DIAGNOSIS — F411 Generalized anxiety disorder: Secondary | ICD-10-CM | POA: Diagnosis not present

## 2021-06-27 DIAGNOSIS — L209 Atopic dermatitis, unspecified: Secondary | ICD-10-CM

## 2021-06-27 LAB — POCT URINE DRUG SCREEN
Methylenedioxyamphetamine: NOT DETECTED
POC Amphetamine UR: NOT DETECTED
POC BENZODIAZEPINES UR: POSITIVE — AB
POC Barbiturate UR: NOT DETECTED
POC Cocaine UR: NOT DETECTED
POC Ecstasy UR: NOT DETECTED
POC Marijuana UR: NOT DETECTED
POC Methadone UR: NOT DETECTED
POC Methamphetamine UR: NOT DETECTED
POC Opiate Ur: NOT DETECTED
POC Oxycodone UR: NOT DETECTED
POC PHENCYCLIDINE UR: NOT DETECTED
POC TRICYCLICS UR: NOT DETECTED

## 2021-06-27 MED ORDER — DESOXIMETASONE 0.25 % EX CREA: 1.0000 "application " | TOPICAL_CREAM | Freq: Two times a day (BID) | CUTANEOUS | 1 refills | Status: DC

## 2021-06-27 MED ORDER — SIMVASTATIN 20 MG PO TABS: 20.0000 mg | ORAL_TABLET | Freq: Every day | ORAL | 3 refills | Status: DC

## 2021-06-27 MED ORDER — AMOXICILLIN-POT CLAVULANATE 875-125 MG PO TABS: 1.0000 | ORAL_TABLET | Freq: Two times a day (BID) | ORAL | 0 refills | Status: DC

## 2021-06-27 MED ORDER — ALPRAZOLAM 0.5 MG PO TABS: 0.5000 mg | ORAL_TABLET | Freq: Every evening | ORAL | 3 refills | Status: DC | PRN

## 2021-06-27 NOTE — Progress Notes (Signed)
[facility] [place], [place] [address]  Internal MEDICINE  Office Visit Note  Patient Name: Alison Oliver  [id number]  [id number]  Date of Service: 06/27/2021  Chief Complaint  Patient presents with   Follow-up    cough & congestion due to uri. Started last Tuesday, neg covid test. Fever, has taken 4 antibiotics and Tylenol, refills   Hyperlipidemia    HPI Breshay presents for a follow up visit for cough and chest congestion. Her symptoms started 7 days ago. She reports having a fever earlier last week, chills, fatigue cough, SOB, chest tightness, wheezing, runny nose, nasal congestion, sore throat, body aches, sinus pain/pressure, and post nasal drip and headaches. She denies any rash, nausea, vomiting, diarrhea, ear pain/fullness or chest pain. Her COVID home test was negative. She tried symptomatic treatment with OTC tylenol and she did have 4 doxycycline pills so she took them twice a day for 2 days with no relief.    Current Medication: Outpatient Encounter Medications as of 06/27/2021  Medication Sig   amoxicillin-clavulanate (AUGMENTIN) 875-125 MG tablet Take 1 tablet by mouth 2 (two) times daily.   doxycycline (VIBRAMYCIN) 100 MG capsule Take 1 capsule (100 mg total) by mouth 2 (two) times daily.   fluticasone (FLONASE) 50 MCG/ACT nasal spray Place 2 sprays into both nostrils daily.   mometasone (ELOCON) 0.1 % cream Apply  a small amount to affected area as directed  qd up to 5 days per week aa rash on fingers prn flares   mupirocin ointment (BACTROBAN) 2 % Apply  a small amount to affected area once a day  qd to wound with dressing changes   triamcinolone (KENALOG) 0.025 % cream Apply 1 application topically 2 (two) times daily.   [DISCONTINUED] ALPRAZolam (XANAX) 0.5 MG tablet Take 1 tablet (0.5 mg total) by mouth at bedtime as needed for anxiety.   [DISCONTINUED] desoximetasone (TOPICORT) 0.25 % cream Apply 1 application topically 2 (two) times daily.    [DISCONTINUED] simvastatin (ZOCOR) 20 MG tablet Take 1 tablet (20 mg total) by mouth at bedtime.   ALPRAZolam (XANAX) 0.5 MG tablet Take 1 tablet (0.5 mg total) by mouth at bedtime as needed for anxiety.   desoximetasone (TOPICORT) 0.25 % cream Apply 1 application topically 2 (two) times daily.   simvastatin (ZOCOR) 20 MG tablet Take 1 tablet (20 mg total) by mouth at bedtime.   No facility-administered encounter medications on file as of 06/27/2021.    Surgical History: Past Surgical History:  Procedure Laterality Date   CATARACT EXTRACTION, BILATERAL Bilateral 09/21,10/21   CESAREAN SECTION      Medical History: Past Medical History:  Diagnosis Date   Basal cell carcinoma 02/13/2010   Left mid ant. thigh. Superficial.    Hx of basal cell carcinoma 02/02/2020   Left upper lip infranasal. Nodular pattern   Hyperlipidemia    Osteoporosis     Family History: Family History  Problem Relation Age of Onset   Breast cancer Maternal Aunt 55   Breast cancer Maternal Grandmother    Bladder Cancer Father    Prostate cancer Brother     Social History   Socioeconomic History   Marital status: Married    Spouse name: Not on file   Number of children: Not on file   Years of education: Not on file   Highest education level: Not on file  Occupational History   Not on file  Tobacco Use   Smoking status: Never   Smokeless  tobacco: Never  Substance and Sexual Activity   Alcohol use: Yes    Comment: social    Drug use: Never   Sexual activity: Not on file  Other Topics Concern   Not on file  Social History Narrative   Not on file   Social Determinants of Health   Financial Resource Strain: Not on file  Food Insecurity: Not on file  Transportation Needs: Not on file  Physical Activity: Not on file  Stress: Not on file  Social Connections: Not on file  Intimate Partner Violence: Not on file      Review of Systems  Constitutional:  Positive for appetite change,  chills, fatigue and fever.  HENT:  Positive for congestion, postnasal drip, rhinorrhea, sinus pressure, sinus pain and sore throat. Negative for ear pain and trouble swallowing.   Eyes: Negative.   Respiratory:  Positive for cough, chest tightness, shortness of breath and wheezing.   Cardiovascular: Negative.  Negative for chest pain and palpitations.  Gastrointestinal: Negative.  Negative for abdominal pain, constipation, diarrhea, nausea and vomiting.  Genitourinary: Negative.   Musculoskeletal:  Positive for myalgias.  Skin: Negative.  Negative for rash.  Neurological:  Positive for headaches. Negative for dizziness and light-headedness.  Psychiatric/Behavioral: Negative.     Vital Signs: BP 110/72   Pulse 65   Temp (!) 97.4 F (36.3 C)   Resp 16   Ht '4\' 11"'$  (1.499 m)   Wt 117 lb (53.1 kg)   SpO2 99%   BMI 23.63 kg/m    Physical Exam Vitals reviewed.  Constitutional:      General: She is not in acute distress.    Appearance: Normal appearance. She is normal weight. She is ill-appearing.  HENT:     Head: Normocephalic and atraumatic.     Right Ear: Tympanic membrane, ear canal and external ear normal.     Left Ear: Tympanic membrane, ear canal and external ear normal.     Nose: Mucosal edema, congestion and rhinorrhea present. Rhinorrhea is purulent.     Right Turbinates: Swollen.     Left Turbinates: Swollen.     Right Sinus: Frontal sinus tenderness present.     Left Sinus: Frontal sinus tenderness present.     Mouth/Throat:     Mouth: Mucous membranes are moist.     Pharynx: Posterior oropharyngeal erythema present. No oropharyngeal exudate.  Eyes:     Extraocular Movements: Extraocular movements intact.     Conjunctiva/sclera: Conjunctivae normal.     Pupils: Pupils are equal, round, and reactive to light.  Cardiovascular:     Rate and Rhythm: Normal rate and regular rhythm.     Pulses: Normal pulses.     Heart sounds: Normal heart sounds. No murmur heard.   No  friction rub. No gallop.  Pulmonary:     Effort: Pulmonary effort is normal. No respiratory distress.     Breath sounds: Normal breath sounds. No wheezing.  Musculoskeletal:     Cervical back: Normal range of motion and neck supple.  Lymphadenopathy:     Cervical: No cervical adenopathy.  Skin:    General: Skin is warm and dry.     Capillary Refill: Capillary refill takes less than 2 seconds.  Neurological:     Mental Status: She is alert and oriented to person, place, and time.  Psychiatric:        Mood and Affect: Mood normal.       Assessment/Plan: 1. Chronic pansinusitis Empiric treatment for persistent chronic  sinus infection. Follow up as needed if symptoms worsen or do not improve.  - amoxicillin-clavulanate (AUGMENTIN) 875-125 MG tablet; Take 1 tablet by mouth 2 (two) times daily.  Dispense: 20 tablet; Refill: 0  2. Pharyngitis, unspecified etiology Patient having sinus infection with pharyngitis, Augmentin will treat both.  - amoxicillin-clavulanate (AUGMENTIN) 875-125 MG tablet; Take 1 tablet by mouth 2 (two) times daily.  Dispense: 20 tablet; Refill: 0  3. Mixed hyperlipidemia Stable, refill ordered.  - simvastatin (ZOCOR) 20 MG tablet; Take 1 tablet (20 mg total) by mouth at bedtime.  Dispense: 90 tablet; Refill: 3  4. Generalized anxiety disorder She is taking alprazolam for anxiety and is requesting  a refill. [place] Controlled Substance Database was reviewed by me for overdose risk score (ORS). ORS is 110. - ALPRAZolam (XANAX) 0.5 MG tablet; Take 1 tablet (0.5 mg total) by mouth at bedtime as needed for anxiety.  Dispense: 30 tablet; Refill: 3  5. Atopic dermatitis, unspecified type History of atopic dermatitis, topical steroid that she uses prn for eczema was refilled.  - desoximetasone (TOPICORT) 0.25 % cream; Apply 1 application topically 2 (two) times daily.  Dispense: 30 g; Refill: 1  6. Encounter for long-term (current) use of medications UDS done today,  results were consistent with current prescription of alprazolam.  - POCT Urine Drug Screen   General Counseling: Elwyn verbalizes understanding of the findings of todays visit and agrees with plan of treatment. I have discussed any further diagnostic evaluation that may be needed or ordered today. We also reviewed her medications today. she has been encouraged to call the office with any questions or concerns that should arise related to todays visit.    Orders Placed This Encounter  Procedures   POCT Urine Drug Screen    Meds ordered this encounter  Medications   amoxicillin-clavulanate (AUGMENTIN) 875-125 MG tablet    Sig: Take 1 tablet by mouth 2 (two) times daily.    Dispense:  20 tablet    Refill:  0   simvastatin (ZOCOR) 20 MG tablet    Sig: Take 1 tablet (20 mg total) by mouth at bedtime.    Dispense:  90 tablet    Refill:  3   ALPRAZolam (XANAX) 0.5 MG tablet    Sig: Take 1 tablet (0.5 mg total) by mouth at bedtime as needed for anxiety.    Dispense:  30 tablet    Refill:  3   desoximetasone (TOPICORT) 0.25 % cream    Sig: Apply 1 application topically 2 (two) times daily.    Dispense:  30 g    Refill:  1    Return in about 4 weeks (around 07/25/2021) for F/U to discuss possible labs , [place] PCP.   Total time spent:30 Minutes Time spent includes review of chart, medications, test results, and follow up plan with the patient.   [place] Controlled Substance Database was reviewed by me. ORS is 110.  This patient was seen by Jonetta Osgood, FNP-C in collaboration with Dr. Clayborn Bigness as a part of collaborative care agreement.   Carvin Almas R. Valetta Fuller, MSN, FNP-C Internal medicine

## 2021-07-05 ENCOUNTER — Telehealth: Payer: Self-pay

## 2021-07-05 ENCOUNTER — Encounter: Payer: Self-pay | Admitting: Nurse Practitioner

## 2021-07-05 ENCOUNTER — Other Ambulatory Visit: Payer: Self-pay

## 2021-07-05 DIAGNOSIS — F411 Generalized anxiety disorder: Secondary | ICD-10-CM

## 2021-07-05 MED ORDER — ALPRAZOLAM 0.5 MG PO TABS: 0.5000 mg | ORAL_TABLET | Freq: Every evening | ORAL | 2 refills | Status: DC | PRN

## 2021-07-05 NOTE — Telephone Encounter (Signed)
Called in [place] and spoke with ann and change refills from 3 to 2  for xanax  30 with 2 refills as per dr Humphrey Rolls

## 2021-07-06 ENCOUNTER — Telehealth: Payer: Self-pay

## 2021-07-06 ENCOUNTER — Other Ambulatory Visit: Payer: Self-pay

## 2021-07-06 MED ORDER — PREDNISONE 10 MG PO TABS: ORAL_TABLET | ORAL | 0 refills | Status: DC

## 2021-07-06 NOTE — Telephone Encounter (Signed)
Lmom that we send pres for prednisone and also she can OTC mucinex

## 2021-08-15 ENCOUNTER — Ambulatory Visit: Payer: Commercial Managed Care - PPO | Admitting: Nurse Practitioner

## 2021-08-17 ENCOUNTER — Other Ambulatory Visit: Payer: Self-pay | Admitting: Nurse Practitioner

## 2021-08-17 DIAGNOSIS — Z1231 Encounter for screening mammogram for malignant neoplasm of breast: Secondary | ICD-10-CM

## 2021-08-31 ENCOUNTER — Ambulatory Visit: Payer: Commercial Managed Care - PPO | Admitting: Nurse Practitioner

## 2021-10-13 ENCOUNTER — Encounter: Payer: Commercial Managed Care - PPO | Admitting: Nurse Practitioner

## 2021-10-17 ENCOUNTER — Telehealth: Payer: Self-pay

## 2021-10-17 NOTE — Telephone Encounter (Signed)
Pt called advising that he has pink eye.  She kept her granddaughter over the weekend and she had pink eye.  I informed pt that she may need an appointment and We were all booked for the week as thanksgiving is this week and I informed pt that she will need to go to urgent care.  It had been since august when pt's last appt was.

## 2021-11-30 ENCOUNTER — Ambulatory Visit
Admission: RE | Admit: 2021-11-30 | Discharge: 2021-11-30 | Disposition: A | Discharge status: Home / Self Care | Payer: Commercial Managed Care - PPO | Source: Ambulatory Visit | Attending: Nurse Practitioner | Admitting: Nurse Practitioner

## 2021-11-30 ENCOUNTER — Other Ambulatory Visit: Payer: Self-pay

## 2021-11-30 DIAGNOSIS — Z1231 Encounter for screening mammogram for malignant neoplasm of breast: Secondary | ICD-10-CM | POA: Insufficient documentation

## 2022-02-07 ENCOUNTER — Encounter: Payer: Self-pay | Admitting: Nurse Practitioner

## 2022-02-07 ENCOUNTER — Ambulatory Visit: Payer: Commercial Managed Care - PPO | Admitting: Nurse Practitioner

## 2022-02-07 ENCOUNTER — Other Ambulatory Visit: Payer: Self-pay

## 2022-02-07 VITALS — BP 104/68 | HR 75 | Temp 98.1°F | Resp 16 | Ht 59.0 in | Wt 119.4 lb

## 2022-02-07 DIAGNOSIS — Z1212 Encounter for screening for malignant neoplasm of rectum: Secondary | ICD-10-CM | POA: Diagnosis not present

## 2022-02-07 DIAGNOSIS — Z1211 Encounter for screening for malignant neoplasm of colon: Secondary | ICD-10-CM | POA: Diagnosis not present

## 2022-02-07 DIAGNOSIS — J0141 Acute recurrent pansinusitis: Secondary | ICD-10-CM

## 2022-02-07 MED ORDER — DOXYCYCLINE HYCLATE 100 MG PO TABS: 100.0000 mg | ORAL_TABLET | Freq: Two times a day (BID) | ORAL | 0 refills | Status: DC

## 2022-02-07 NOTE — Progress Notes (Signed)
[facility] ?[address] ?[place], [place] [address] ? ?Internal MEDICINE  ?Office Visit Note ? ?Patient Name: Alison Oliver ? [id number]  ?[id number] ? ?Date of Service: 02/07/2022 ? ?Chief Complaint  ?Patient presents with  ? Acute Visit  ?  Neg. Covid sunday, thick yellow bloody mucus,ears ringing  ? Sinusitis  ? Chills  ? Cough  ?  horse  ? ? ? ?HPI ?Alison Oliver presents for an acute sick visit for symptoms of sinusitis.  She reports experiencing nasal congestion, ear pressure, sinus pressure, runny nose, sinus drainage, sore throat, cough, slight wheezing, fatigue and chills.  She denies any shortness of breath, chest tightness, or headaches.  She has a history of chronic recurrent sinusitis.  She had COVID in December.  She took 2 COVID tests on Sunday and today and they were negative.  She also reports having thick yellow and slightly blood-tinged nasal drainage and feels like her ears are ringing.  Her voice is also hoarse. ?Patient is due for colorectal cancer screening and would like to repeat the Cologuard stool test again. ? ? ? ? ?Current Medication: ? ?Outpatient Encounter Medications as of 02/07/2022  ?Medication Sig  ? ALPRAZolam (XANAX) 0.5 MG tablet Take 1 tablet (0.5 mg total) by mouth at bedtime as needed for anxiety.  ? desoximetasone (TOPICORT) 0.25 % cream Apply 1 application topically 2 (two) times daily.  ? doxycycline (VIBRA-TABS) 100 MG tablet Take 1 tablet (100 mg total) by mouth 2 (two) times daily.  ? fluticasone (FLONASE) 50 MCG/ACT nasal spray Place 2 sprays into both nostrils daily.  ? mometasone (ELOCON) 0.1 % cream Apply  a small amount to affected area as directed  qd up to 5 days per week aa rash on fingers prn flares  ? mupirocin ointment (BACTROBAN) 2 % Apply  a small amount to affected area once a day  qd to wound with dressing changes  ? simvastatin (ZOCOR) 20 MG tablet Take 1 tablet (20 mg total) by mouth at bedtime.  ? triamcinolone (KENALOG) 0.025 % cream Apply 1  application topically 2 (two) times daily.  ? [DISCONTINUED] amoxicillin-clavulanate (AUGMENTIN) 875-125 MG tablet Take 1 tablet by mouth 2 (two) times daily.  ? [DISCONTINUED] doxycycline (VIBRAMYCIN) 100 MG capsule Take 1 capsule (100 mg total) by mouth 2 (two) times daily.  ? [DISCONTINUED] predniSONE (DELTASONE) 10 MG tablet Take 1 tab po 3 x day for 3 days then take 1 tab po 2 x a day for 3 days and then take 1 tab po daily for 3 days  ? ?No facility-administered encounter medications on file as of 02/07/2022.  ? ? ? ? ?Medical History: ?Past Medical History:  ?Diagnosis Date  ? Basal cell carcinoma 02/13/2010  ? Left mid ant. thigh. Superficial.   ? Hx of basal cell carcinoma 02/02/2020  ? Left upper lip infranasal. Nodular pattern  ? Hyperlipidemia   ? Osteoporosis   ? ? ? ?Vital Signs: ?BP 104/68   Pulse 75   Temp 98.1 ?F (36.7 ?C)   Resp 16   Ht '4\' 11"'$  (1.499 m)   Wt 119 lb 6.4 oz (54.2 kg)   SpO2 99%   BMI 24.12 kg/m?  ? ? ?Review of Systems  ?Constitutional:  Positive for chills, fatigue and fever.  ?HENT:  Positive for congestion, ear pain, postnasal drip, rhinorrhea, sinus pressure, sore throat and voice change.   ?Respiratory:  Positive for cough and wheezing. Negative for chest tightness and shortness of breath.   ?  Cardiovascular:  Negative for chest pain and palpitations.  ?Gastrointestinal:  Negative for abdominal pain, constipation, diarrhea, nausea and vomiting.  ?Musculoskeletal:  Negative for myalgias.  ?Skin:  Negative for rash.  ?Neurological:  Negative for headaches.  ? ?Physical Exam ?Vitals reviewed.  ?Constitutional:   ?   General: She is not in acute distress. ?   Appearance: Normal appearance. She is normal weight. She is ill-appearing.  ?HENT:  ?   Head: Normocephalic and atraumatic.  ?   Right Ear: Tympanic membrane normal. Swelling and tenderness present. No middle ear effusion. Tympanic membrane is not erythematous or bulging.  ?   Left Ear: Tympanic membrane normal. Swelling  and tenderness present.  No middle ear effusion. Tympanic membrane is not erythematous or bulging.  ?   Nose: Mucosal edema, congestion and rhinorrhea present. Rhinorrhea is purulent and bloody.  ?   Right Turbinates: Swollen (erythematous).  ?   Left Turbinates: Swollen (erythematous).  ?   Right Sinus: Maxillary sinus tenderness and frontal sinus tenderness present.  ?   Left Sinus: Maxillary sinus tenderness and frontal sinus tenderness present.  ?   Mouth/Throat:  ?   Lips: Pink.  ?   Mouth: Mucous membranes are moist.  ?   Pharynx: Uvula midline. Posterior oropharyngeal erythema present.  ?   Tonsils: No tonsillar exudate or tonsillar abscesses.  ?Eyes:  ?   General: Lids are normal. Vision grossly intact. Gaze aligned appropriately.  ?   Pupils: Pupils are equal, round, and reactive to light.  ?Neck:  ?   Trachea: Trachea and phonation normal.  ?Cardiovascular:  ?   Rate and Rhythm: Normal rate and regular rhythm.  ?   Heart sounds: Normal heart sounds, S1 normal and S2 normal.  ?Pulmonary:  ?   Effort: Pulmonary effort is normal. No accessory muscle usage or respiratory distress.  ?   Breath sounds: Normal breath sounds and air entry.  ?Musculoskeletal:  ?   Right lower leg: No edema.  ?   Left lower leg: No edema.  ?Lymphadenopathy:  ?   Cervical: No cervical adenopathy.  ?Neurological:  ?   Mental Status: She is alert and oriented to person, place, and time.  ?Psychiatric:     ?   Mood and Affect: Mood normal.     ?   Behavior: Behavior normal.  ? ? ? ? ?Assessment/Plan: ?1. Acute recurrent pansinusitis ?Empiric antibiotic treatment prescribed ?- doxycycline (VIBRA-TABS) 100 MG tablet; Take 1 tablet (100 mg total) by mouth 2 (two) times daily.  Dispense: 20 tablet; Refill: 0 ? ?2. Screening for colorectal cancer ?Cologuard stool test ordered ?- Cologuard ? ? ?General Counseling: Lysette verbalizes understanding of the findings of todays visit and agrees with plan of treatment. I have discussed any further  diagnostic evaluation that may be needed or ordered today. We also reviewed her medications today. she has been encouraged to call the office with any questions or concerns that should arise related to todays visit. ? ? ? ?Counseling: ? ? ? ?Orders Placed This Encounter  ?Procedures  ? Cologuard  ? ? ?Meds ordered this encounter  ?Medications  ? doxycycline (VIBRA-TABS) 100 MG tablet  ?  Sig: Take 1 tablet (100 mg total) by mouth 2 (two) times daily.  ?  Dispense:  20 tablet  ?  Refill:  0  ? ? ?Return if symptoms worsen or fail to improve. ? ?[place] Controlled Substance Database was reviewed by me for overdose risk score (ORS) ? ?  Time spent:30 Minutes ?Time spent with patient included reviewing progress notes, labs, imaging studies, and discussing plan for follow up.  ? ?This patient was seen by Jonetta Osgood, FNP-C in collaboration with Dr. Clayborn Bigness as a part of collaborative care agreement. ? ?Gurjot Brisco R. Valetta Fuller, MSN, FNP-C ?Internal Medicine ?

## 2022-03-08 LAB — COLOGUARD: COLOGUARD: NEGATIVE

## 2022-03-12 ENCOUNTER — Telehealth: Payer: Self-pay

## 2022-03-12 NOTE — Telephone Encounter (Signed)
Left vm and sent mychart message to confirm 03/15/22 appointment-Toni ?

## 2022-03-13 ENCOUNTER — Other Ambulatory Visit: Payer: Self-pay | Admitting: Nurse Practitioner

## 2022-03-14 LAB — CBC WITH DIFFERENTIAL/PLATELET
Basophils Absolute: 0.1 10*3/uL (ref 0.0–0.2)
Basos: 1 %
EOS (ABSOLUTE): 0.1 10*3/uL (ref 0.0–0.4)
Eos: 1 %
Hematocrit: 38.4 % (ref 34.0–46.6)
Hemoglobin: 13.4 g/dL (ref 11.1–15.9)
Immature Grans (Abs): 0 10*3/uL (ref 0.0–0.1)
Immature Granulocytes: 0 %
Lymphocytes Absolute: 2.4 10*3/uL (ref 0.7–3.1)
Lymphs: 33 %
MCH: 30.6 pg (ref 26.6–33.0)
MCHC: 34.9 g/dL (ref 31.5–35.7)
MCV: 88 fL (ref 79–97)
Monocytes Absolute: 0.6 10*3/uL (ref 0.1–0.9)
Monocytes: 8 %
Neutrophils Absolute: 4 10*3/uL (ref 1.4–7.0)
Neutrophils: 57 %
Platelets: 258 10*3/uL (ref 150–450)
RBC: 4.38 x10E6/uL (ref 3.77–5.28)
RDW: 12.4 % (ref 11.7–15.4)
WBC: 7.1 10*3/uL (ref 3.4–10.8)

## 2022-03-14 LAB — VITAMIN D 25 HYDROXY (VIT D DEFICIENCY, FRACTURES): Vit D, 25-Hydroxy: 68.5 ng/mL (ref 30.0–100.0)

## 2022-03-14 LAB — COMPREHENSIVE METABOLIC PANEL
ALT: 23 IU/L (ref 0–32)
AST: 34 IU/L (ref 0–40)
Albumin/Globulin Ratio: 1.8 (ref 1.2–2.2)
Albumin: 4.5 g/dL (ref 3.8–4.8)
Alkaline Phosphatase: 100 IU/L (ref 44–121)
BUN/Creatinine Ratio: 32 — ABNORMAL HIGH (ref 12–28)
BUN: 22 mg/dL (ref 8–27)
Bilirubin Total: 0.4 mg/dL (ref 0.0–1.2)
CO2: 21 mmol/L (ref 20–29)
Calcium: 9.2 mg/dL (ref 8.7–10.3)
Chloride: 101 mmol/L (ref 96–106)
Creatinine, Ser: 0.69 mg/dL (ref 0.57–1.00)
Globulin, Total: 2.5 g/dL (ref 1.5–4.5)
Glucose: 92 mg/dL (ref 70–99)
Potassium: 4.5 mmol/L (ref 3.5–5.2)
Sodium: 139 mmol/L (ref 134–144)
Total Protein: 7 g/dL (ref 6.0–8.5)
eGFR: 98 mL/min/{1.73_m2} (ref >59)

## 2022-03-14 LAB — LIPID PANEL
Chol/HDL Ratio: 4.3 ratio (ref 0.0–4.4)
Cholesterol, Total: 177 mg/dL (ref 100–199)
HDL: 41 mg/dL (ref >39)
LDL Chol Calc (NIH): 112 mg/dL — ABNORMAL HIGH (ref 0–99)
Triglycerides: 136 mg/dL (ref 0–149)
VLDL Cholesterol Cal: 24 mg/dL (ref 5–40)

## 2022-03-14 LAB — TSH: TSH: 1.79 u[IU]/mL (ref 0.450–4.500)

## 2022-03-14 LAB — T4, FREE: Free T4: 1.12 ng/dL (ref 0.82–1.77)

## 2022-03-15 ENCOUNTER — Ambulatory Visit (INDEPENDENT_AMBULATORY_CARE_PROVIDER_SITE_OTHER): Payer: Commercial Managed Care - PPO | Admitting: Nurse Practitioner

## 2022-03-15 ENCOUNTER — Encounter: Payer: Self-pay | Admitting: Nurse Practitioner

## 2022-03-15 VITALS — BP 100/72 | HR 71 | Temp 98.5°F | Resp 16 | Ht 59.0 in | Wt 119.0 lb

## 2022-03-15 DIAGNOSIS — Z0001 Encounter for general adult medical examination with abnormal findings: Secondary | ICD-10-CM

## 2022-03-15 DIAGNOSIS — F411 Generalized anxiety disorder: Secondary | ICD-10-CM | POA: Diagnosis not present

## 2022-03-15 DIAGNOSIS — E782 Mixed hyperlipidemia: Secondary | ICD-10-CM

## 2022-03-15 DIAGNOSIS — R300 Dysuria: Secondary | ICD-10-CM | POA: Diagnosis not present

## 2022-03-15 DIAGNOSIS — B3731 Acute candidiasis of vulva and vagina: Secondary | ICD-10-CM | POA: Diagnosis not present

## 2022-03-15 LAB — POCT URINALYSIS DIPSTICK
Blood, UA: NEGATIVE
Glucose, UA: NEGATIVE
Nitrite, UA: NEGATIVE
Protein, UA: POSITIVE — AB
Spec Grav, UA: 1.01 (ref 1.010–1.025)
Urobilinogen, UA: 0.2 E.U./dL
pH, UA: 6.5 (ref 5.0–8.0)

## 2022-03-15 MED ORDER — FLUCONAZOLE 150 MG PO TABS: 150.0000 mg | ORAL_TABLET | Freq: Once | ORAL | 0 refills | Status: AC

## 2022-03-15 MED ORDER — ALPRAZOLAM 0.5 MG PO TABS: 0.5000 mg | ORAL_TABLET | Freq: Every evening | ORAL | 2 refills | Status: DC | PRN

## 2022-03-15 NOTE — Progress Notes (Signed)
[facility] ?[address] ?[place], [place] [address] ? ?Internal MEDICINE  ?Office Visit Note ? ?Patient Name: Alison Oliver ? [id number]  ?[id number] ? ?Date of Service: 03/15/2022 ? ?Chief Complaint  ?Patient presents with  ? Annual Exam  ?  Hemroids for about 6 months to a year, tired in the afternoons  ? Arthritis  ? Urinary Tract Infection  ?  Itching, no burning, cloudy urine, lower back pain worse after going to the bathroom, dull ache, sometimes odor, started a couple weeks ago   ? Results  ?  Blood work  ? ? ?HPI ?Chace presents for an annual well visit and physical exam.  He is a well-appearing 61 year old female with hyperlipidemia, arthritis, and generalized anxiety disorder.  She had her labs drawn prior to her office visit today and her CBC and metabolic panel are normal, kidney function and liver function are normal via labs.  Her vitamin D level was normal, thyroid levels are normal.  Her cholesterol levels are significantly improved on simvastatin 20 mg daily with the only abnormal value being her LDL at 112 which is mildly elevated.  Her weight and BMI are within normal range her blood pressure is within normal limits and her other vital signs are also normal. ?She has had a couple bouts of sinusitis in the past which seems to be resolved. ?She does report having hemorrhoids for the past 6 months to a year and she is often feeling tired in the afternoons. Using OTC medications for hemorrhoids. ?She reports having symptoms of a urinary tract infection including itching, cloudy urine, low back pain that worsens after using the restroom to a dull ache.  She has also noticed that there is an odor sometimes.  These symptoms started approximately 1 to 2 weeks ago. Urinalysis was positive for small leukocytes.  ?She had her mammogram done in January this year and it was BI-RADS Category 1 negative. ?For colorectal cancer screening she did the Cologuard stool test earlier this month and the  result was negative. ?She had a Pap done in November 2020 that was negative for malignancy and HPV.  The earliest she can have her Pap done would be November this year but this can also be postponed until 2025 at the latest ? ? ?Current Medication: ?Outpatient Encounter Medications as of 03/15/2022  ?Medication Sig  ? desoximetasone (TOPICORT) 0.25 % cream Apply 1 application topically 2 (two) times daily.  ? fluconazole (DIFLUCAN) 150 MG tablet Take 1 tablet (150 mg total) by mouth once for 1 dose. May take an additional dose after 3 days if still symptomatic.  ? fluticasone (FLONASE) 50 MCG/ACT nasal spray Place 2 sprays into both nostrils daily.  ? mometasone (ELOCON) 0.1 % cream Apply  a small amount to affected area as directed  qd up to 5 days per week aa rash on fingers prn flares  ? mupirocin ointment (BACTROBAN) 2 % Apply  a small amount to affected area once a day  qd to wound with dressing changes  ? simvastatin (ZOCOR) 20 MG tablet Take 1 tablet (20 mg total) by mouth at bedtime.  ? triamcinolone (KENALOG) 0.025 % cream Apply 1 application topically 2 (two) times daily.  ? [DISCONTINUED] ALPRAZolam (XANAX) 0.5 MG tablet Take 1 tablet (0.5 mg total) by mouth at bedtime as needed for anxiety.  ? [DISCONTINUED] doxycycline (VIBRA-TABS) 100 MG tablet Take 1 tablet (100 mg total) by mouth 2 (two) times daily.  ? ALPRAZolam (XANAX) 0.5 MG  tablet Take 1 tablet (0.5 mg total) by mouth at bedtime as needed for anxiety.  ? ?No facility-administered encounter medications on file as of 03/15/2022.  ? ? ?Surgical History: ?Past Surgical History:  ?Procedure Laterality Date  ? CATARACT EXTRACTION, BILATERAL Bilateral 09/21,10/21  ? CESAREAN SECTION    ? ? ?Medical History: ?Past Medical History:  ?Diagnosis Date  ? Basal cell carcinoma 02/13/2010  ? Left mid ant. thigh. Superficial.   ? Hx of basal cell carcinoma 02/02/2020  ? Left upper lip infranasal. Nodular pattern  ? Hyperlipidemia   ? Osteoporosis   ? ? ?Family  History: ?Family History  ?Problem Relation Age of Onset  ? Breast cancer Maternal Aunt 51  ? Breast cancer Maternal Grandmother   ? Bladder Cancer Father   ? Prostate cancer Brother   ? ? ?Social History  ? ?Socioeconomic History  ? Marital status: Married  ?  Spouse name: Not on file  ? Number of children: Not on file  ? Years of education: Not on file  ? Highest education level: Not on file  ?Occupational History  ? Not on file  ?Tobacco Use  ? Smoking status: Never  ? Smokeless tobacco: Never  ?Substance and Sexual Activity  ? Alcohol use: Yes  ?  Comment: social   ? Drug use: Never  ? Sexual activity: Not on file  ?Other Topics Concern  ? Not on file  ?Social History Narrative  ? Not on file  ? ?Social Determinants of Health  ? ?Financial Resource Strain: Not on file  ?Food Insecurity: Not on file  ?Transportation Needs: Not on file  ?Physical Activity: Not on file  ?Stress: Not on file  ?Social Connections: Not on file  ?Intimate Partner Violence: Not on file  ? ? ? ? ?Review of Systems  ?Constitutional:  Positive for fatigue. Negative for activity change, appetite change, chills, fever and unexpected weight change.  ?HENT: Negative.  Negative for congestion, ear pain, rhinorrhea, sore throat and trouble swallowing.   ?Eyes: Negative.   ?Respiratory: Negative.  Negative for cough, chest tightness, shortness of breath and wheezing.   ?Cardiovascular: Negative.  Negative for chest pain and palpitations.  ?Gastrointestinal: Negative.  Negative for abdominal pain, blood in stool, constipation, diarrhea, nausea and vomiting.  ?Endocrine: Negative.   ?Genitourinary: Negative.  Negative for difficulty urinating, dysuria, frequency, hematuria, urgency, vaginal bleeding, vaginal discharge and vaginal pain.  ?     Vaginal itching  ?Musculoskeletal:  Positive for arthritis. Negative for arthralgias, back pain, joint swelling, myalgias and neck pain.  ?Skin: Negative.  Negative for rash and wound.  ?Allergic/Immunologic:  Negative.  Negative for immunocompromised state.  ?Neurological: Negative.  Negative for dizziness, seizures, numbness and headaches.  ?Hematological: Negative.   ?Psychiatric/Behavioral: Negative.  Negative for behavioral problems, self-injury and suicidal ideas. The patient is not nervous/anxious.   ? ?Vital Signs: ?BP 100/72   Pulse 71   Temp 98.5 ?F (36.9 ?C)   Resp 16   Ht '4\' 11"'$  (1.499 m)   Wt 119 lb (54 kg)   SpO2 99%   BMI 24.04 kg/m?  ? ? ?Physical Exam ?Vitals reviewed.  ?Constitutional:   ?   General: She is awake. She is not in acute distress. ?   Appearance: Normal appearance. She is well-developed, well-groomed and normal weight. She is not ill-appearing or diaphoretic.  ?HENT:  ?   Head: Normocephalic and atraumatic.  ?   Right Ear: Tympanic membrane, ear canal and external ear normal.  ?  Left Ear: Tympanic membrane, ear canal and external ear normal.  ?   Nose: Nose normal. No congestion or rhinorrhea.  ?   Mouth/Throat:  ?   Lips: Pink.  ?   Mouth: Mucous membranes are moist.  ?   Pharynx: Oropharynx is clear. Uvula midline. No oropharyngeal exudate or posterior oropharyngeal erythema.  ?Eyes:  ?   General: Lids are normal. Vision grossly intact. Gaze aligned appropriately. No scleral icterus.    ?   Right eye: No discharge.     ?   Left eye: No discharge.  ?   Extraocular Movements: Extraocular movements intact.  ?   Conjunctiva/sclera: Conjunctivae normal.  ?   Pupils: Pupils are equal, round, and reactive to light.  ?   Funduscopic exam: ?   Right eye: Red reflex present.     ?   Left eye: Red reflex present. ?Neck:  ?   Thyroid: No thyromegaly.  ?   Vascular: No carotid bruit or JVD.  ?   Trachea: Trachea and phonation normal. No tracheal deviation.  ?Cardiovascular:  ?   Rate and Rhythm: Normal rate and regular rhythm.  ?   Pulses: Normal pulses.  ?   Heart sounds: Normal heart sounds, S1 normal and S2 normal. No murmur heard. ?  No friction rub. No gallop.  ?Pulmonary:  ?   Effort:  Pulmonary effort is normal. No accessory muscle usage or respiratory distress.  ?   Breath sounds: Normal breath sounds and air entry. No stridor. No wheezing or rales.  ?Chest:  ?   Chest wall: No tenderness.  ?

## 2022-03-19 LAB — CULTURE, URINE COMPREHENSIVE

## 2022-04-12 ENCOUNTER — Encounter: Payer: Commercial Managed Care - PPO | Admitting: Dermatology

## 2022-05-16 ENCOUNTER — Encounter: Payer: Commercial Managed Care - PPO | Admitting: Dermatology

## 2022-07-11 ENCOUNTER — Ambulatory Visit (INDEPENDENT_AMBULATORY_CARE_PROVIDER_SITE_OTHER): Payer: BLUE CROSS/BLUE SHIELD | Admitting: Dermatology

## 2022-07-11 DIAGNOSIS — L820 Inflamed seborrheic keratosis: Secondary | ICD-10-CM | POA: Diagnosis not present

## 2022-07-11 DIAGNOSIS — D229 Melanocytic nevi, unspecified: Secondary | ICD-10-CM

## 2022-07-11 DIAGNOSIS — Z1283 Encounter for screening for malignant neoplasm of skin: Secondary | ICD-10-CM | POA: Diagnosis not present

## 2022-07-11 DIAGNOSIS — D2371 Other benign neoplasm of skin of right lower limb, including hip: Secondary | ICD-10-CM

## 2022-07-11 DIAGNOSIS — L814 Other melanin hyperpigmentation: Secondary | ICD-10-CM | POA: Diagnosis not present

## 2022-07-11 DIAGNOSIS — L578 Other skin changes due to chronic exposure to nonionizing radiation: Secondary | ICD-10-CM | POA: Diagnosis not present

## 2022-07-11 DIAGNOSIS — L821 Other seborrheic keratosis: Secondary | ICD-10-CM

## 2022-07-11 DIAGNOSIS — Z85828 Personal history of other malignant neoplasm of skin: Secondary | ICD-10-CM

## 2022-07-11 DIAGNOSIS — D1800 Hemangioma unspecified site: Secondary | ICD-10-CM

## 2022-07-11 NOTE — Progress Notes (Signed)
Follow-Up Visit   Subjective  Alison Oliver is a 61 y.o. female who presents for the following: Annual Exam (Hx BCC). The patient presents for Total-Body Skin Exam (TBSE) for skin cancer screening and mole check.  The patient has spots, moles and lesions to be evaluated, some may be new or changing and the patient has concerns that these could be cancer.  The following portions of the chart were reviewed this encounter and updated as appropriate:   Tobacco  Allergies  Meds  Problems  Med Hx  Surg Hx  Fam Hx     Review of Systems:  No other skin or systemic complaints except as noted in HPI or Assessment and Plan.  Objective  Well appearing patient in no apparent distress; mood and affect are within normal limits.  A full examination was performed including scalp, head, eyes, ears, nose, lips, neck, chest, axillae, abdomen, back, buttocks, bilateral upper extremities, bilateral lower extremities, hands, feet, fingers, toes, fingernails, and toenails. All findings within normal limits unless otherwise noted below.  L hand x 2, back x 1 (3) Erythematous stuck-on, waxy papule or plaque   Assessment & Plan  Inflamed seborrheic keratosis (3) L hand x 2, back x 1 Symptomatic, irritating, patient would like treated. Destruction of lesion - L hand x 2, back x 1 Complexity: simple   Destruction method: cryotherapy   Informed consent: discussed and consent obtained   Timeout:  patient name, date of birth, surgical site, and procedure verified Lesion destroyed using liquid nitrogen: Yes   Region frozen until ice ball extended beyond lesion: Yes   Outcome: patient tolerated procedure well with no complications   Post-procedure details: wound care instructions given    Lentigines - Scattered tan macules - Due to sun exposure - Benign-appearing, observe - Recommend daily broad spectrum sunscreen SPF 30+ to sun-exposed areas, reapply every 2 hours as needed. - Call for any  changes  Seborrheic Keratoses - Stuck-on, waxy, tan-brown papules and/or plaques  - Benign-appearing - Discussed benign etiology and prognosis. - Observe - Call for any changes  Melanocytic Nevi - Tan-brown and/or pink-flesh-colored symmetric macules and papules - Benign appearing on exam today - Observation - Call clinic for new or changing moles - Recommend daily use of broad spectrum spf 30+ sunscreen to sun-exposed areas.   Hemangiomas - Red papules - Discussed benign nature - Observe - Call for any changes  Actinic Damage - Chronic condition, secondary to cumulative UV/sun exposure - diffuse scaly erythematous macules with underlying dyspigmentation - Recommend daily broad spectrum sunscreen SPF 30+ to sun-exposed areas, reapply every 2 hours as needed.  - Staying in the shade or wearing long sleeves, sun glasses (UVA+UVB protection) and wide brim hats (4-inch brim around the entire circumference of the hat) are also recommended for sun protection.  - Call for new or changing lesions.  History of Basal Cell Carcinoma of the Skin - No evidence of recurrence today - Recommend regular full body skin exams - Recommend daily broad spectrum sunscreen SPF 30+ to sun-exposed areas, reapply every 2 hours as needed.  - Call if any new or changing lesions are noted between office visits  Dermatofibroma - L med thigh 0.5 cm - Firm pink/brown papulenodule with dimple sign - Benign appearing - Call for any changes  Skin cancer screening performed today.  Return in about 1 year (around 07/12/2023) for TBSE.  Luther Redo, CMA, am acting as scribe for Sarina Ser, MD . Documentation: I  have reviewed the above documentation for accuracy and completeness, and I agree with the above.  Sarina Ser, MD

## 2022-07-11 NOTE — Patient Instructions (Signed)
Due to recent changes in healthcare laws, you may see results of your pathology and/or laboratory studies on MyChart before the doctors have had a chance to review them. We understand that in some cases there may be results that are confusing or concerning to you. Please understand that not all results are received at the same time and often the doctors may need to interpret multiple results in order to provide you with the best plan of care or course of treatment. Therefore, we ask that you please give us 2 business days to thoroughly review all your results before contacting the office for clarification. Should we see a critical lab result, you will be contacted sooner.   If You Need Anything After Your Visit  If you have any questions or concerns for your doctor, please call our main line at [phone] and press option 4 to reach your doctor's medical assistant. If no one answers, please leave a voicemail as directed and we will return your call as soon as possible. Messages left after 4 pm will be answered the following business day.   You may also send us a message via MyChart. We typically respond to MyChart messages within 1-2 business days.  For prescription refills, please ask your pharmacy to contact our office. Our fax number is [phone].  If you have an urgent issue when the clinic is closed that cannot wait until the next business day, you can page your doctor at the number below.    Please note that while we do our best to be available for urgent issues outside of office hours, we are not available 24/7.   If you have an urgent issue and are unable to reach us, you may choose to seek medical care at your doctor's office, retail clinic, urgent care center, or emergency room.  If you have a medical emergency, please immediately call 911 or go to the emergency department.  Pager Numbers  - Dr. Kowalski: [phone]  - Dr. Moye: [phone]  - Dr. Stewart:  [phone]  In the event of inclement weather, please call our main line at [phone] for an update on the status of any delays or closures.  Dermatology Medication Tips: Please keep the boxes that topical medications come in in order to help keep track of the instructions about where and how to use these. Pharmacies typically print the medication instructions only on the boxes and not directly on the medication tubes.   If your medication is too expensive, please contact our office at [phone] option 4 or send us a message through MyChart.   We are unable to tell what your co-pay for medications will be in advance as this is different depending on your insurance coverage. However, we may be able to find a substitute medication at lower cost or fill out paperwork to get insurance to cover a needed medication.   If a prior authorization is required to get your medication covered by your insurance company, please allow us 1-2 business days to complete this process.  Drug prices often vary depending on where the prescription is filled and some pharmacies may offer cheaper prices.  The website www.goodrx.com contains coupons for medications through different pharmacies. The prices here do not account for what the cost may be with help from insurance (it may be cheaper with your insurance), but the website can give you the price if you did not use any insurance.  - You can print the associated coupon and take it with   your prescription to the pharmacy.  - You may also stop by our office during regular business hours and pick up a GoodRx coupon card.  - If you need your prescription sent electronically to a different pharmacy, notify our office through [facility] MyChart or by phone at [phone] option 4.     Si Usted Necesita Algo Despus de Su Visita  Tambin puede enviarnos un mensaje a travs de MyChart. Por lo general respondemos a los mensajes de MyChart en el transcurso de 1 a 2  das hbiles.  Para renovar recetas, por favor pida a su farmacia que se ponga en contacto con nuestra oficina. Nuestro nmero de fax es el [phone].  Si tiene un asunto urgente cuando la clnica est cerrada y que no puede esperar hasta el siguiente da hbil, puede llamar/localizar a su doctor(a) al nmero que aparece a continuacin.   Por favor, tenga en cuenta que aunque hacemos todo lo posible para estar disponibles para asuntos urgentes fuera del horario de oficina, no estamos disponibles las 24 horas del da, los 7 das de la semana.   Si tiene un problema urgente y no puede comunicarse con nosotros, puede optar por buscar atencin mdica  en el consultorio de su doctor(a), en una clnica privada, en un centro de atencin urgente o en una sala de emergencias.  Si tiene una emergencia mdica, por favor llame inmediatamente al 911 o vaya a la sala de emergencias.  Nmeros de bper  - Dr. Kowalski: [phone]  - Dra. Moye: [phone]  - Dra. Stewart: [phone]  En caso de inclemencias del tiempo, por favor llame a nuestra lnea principal al [phone] para una actualizacin sobre el estado de cualquier retraso o cierre.  Consejos para la medicacin en dermatologa: Por favor, guarde las cajas en las que vienen los medicamentos de uso tpico para ayudarle a seguir las instrucciones sobre dnde y cmo usarlos. Las farmacias generalmente imprimen las instrucciones del medicamento slo en las cajas y no directamente en los tubos del medicamento.   Si su medicamento es muy caro, por favor, pngase en contacto con nuestra oficina llamando al [phone] y presione la opcin 4 o envenos un mensaje a travs de MyChart.   No podemos decirle cul ser su copago por los medicamentos por adelantado ya que esto es diferente dependiendo de la cobertura de su seguro. Sin embargo, es posible que podamos encontrar un medicamento sustituto a menor costo o llenar un formulario para que el  seguro cubra el medicamento que se considera necesario.   Si se requiere una autorizacin previa para que su compaa de seguros cubra su medicamento, por favor permtanos de 1 a 2 das hbiles para completar este proceso.  Los precios de los medicamentos varan con frecuencia dependiendo del lugar de dnde se surte la receta y alguna farmacias pueden ofrecer precios ms baratos.  El sitio web www.goodrx.com tiene cupones para medicamentos de diferentes farmacias. Los precios aqu no tienen en cuenta lo que podra costar con la ayuda del seguro (puede ser ms barato con su seguro), pero el sitio web puede darle el precio si no utiliz ningn seguro.  - Puede imprimir el cupn correspondiente y llevarlo con su receta a la farmacia.  - Tambin puede pasar por nuestra oficina durante el horario de atencin regular y recoger una tarjeta de cupones de GoodRx.  - Si necesita que su receta se enve electrnicamente a una farmacia diferente, informe a nuestra oficina a travs de MyChart de [facility]   o por telfono llamando al [phone] y presione la opcin 4.  

## 2022-07-17 ENCOUNTER — Encounter: Payer: Self-pay | Admitting: Dermatology

## 2022-07-21 ENCOUNTER — Other Ambulatory Visit: Payer: Self-pay | Admitting: Nurse Practitioner

## 2022-07-21 DIAGNOSIS — L209 Atopic dermatitis, unspecified: Secondary | ICD-10-CM

## 2022-07-22 NOTE — Telephone Encounter (Signed)
Med sent to pharmacy, this is prescribed by me and is a topical treatment she uses as needed for flare ups

## 2022-07-26 ENCOUNTER — Other Ambulatory Visit: Payer: Self-pay | Admitting: Nurse Practitioner

## 2022-07-26 DIAGNOSIS — E782 Mixed hyperlipidemia: Secondary | ICD-10-CM

## 2022-09-13 ENCOUNTER — Ambulatory Visit: Payer: BLUE CROSS/BLUE SHIELD | Admitting: Nurse Practitioner

## 2022-09-13 ENCOUNTER — Encounter: Payer: Self-pay | Admitting: Nurse Practitioner

## 2022-09-13 ENCOUNTER — Telehealth: Payer: Self-pay | Admitting: Nurse Practitioner

## 2022-09-13 VITALS — BP 114/70 | HR 67 | Temp 97.1°F | Resp 16 | Ht 59.0 in | Wt 121.2 lb

## 2022-09-13 DIAGNOSIS — J0110 Acute frontal sinusitis, unspecified: Secondary | ICD-10-CM | POA: Diagnosis not present

## 2022-09-13 DIAGNOSIS — R5383 Other fatigue: Secondary | ICD-10-CM | POA: Diagnosis not present

## 2022-09-13 DIAGNOSIS — E538 Deficiency of other specified B group vitamins: Secondary | ICD-10-CM

## 2022-09-13 DIAGNOSIS — E782 Mixed hyperlipidemia: Secondary | ICD-10-CM

## 2022-09-13 DIAGNOSIS — Z760 Encounter for issue of repeat prescription: Secondary | ICD-10-CM

## 2022-09-13 MED ORDER — ALPRAZOLAM 0.5 MG PO TABS: 0.5000 mg | ORAL_TABLET | Freq: Every evening | ORAL | 2 refills | Status: DC | PRN

## 2022-09-13 MED ORDER — DOXYCYCLINE HYCLATE 100 MG PO TABS: 100.0000 mg | ORAL_TABLET | Freq: Two times a day (BID) | ORAL | 0 refills | Status: DC

## 2022-09-13 NOTE — Progress Notes (Signed)
[facility] [place], [place] [address]  Internal MEDICINE  Office Visit Note  Patient Name: Alison Oliver  [id number]  [id number]  Date of Service: 09/13/2022  Chief Complaint  Patient presents with   Follow-up   Hyperlipidemia    HPI Estefanie presents for a follow up visit for fatigue, constipation, med refills.  --Constipation -- rectal itching, Some straining, Takes magnesium daily to have reg BM --Fatigue -- Feeling tired most of the time, wants labs checked, takes vitamins --noticed Orange discoloration on eye lids, worried about her cholesterol --feels like she has a sinus infection -- yellow drainage, nasal congestion, sore throat, runny nose, sinus pressure, headaches and cough.    Current Medication: Outpatient Encounter Medications as of 09/13/2022  Medication Sig   desoximetasone (TOPICORT) 0.25 % cream APPLY  CREAM EXTERNALLY TO AFFECTED AREA TWICE DAILY   doxycycline (VIBRA-TABS) 100 MG tablet Take 1 tablet (100 mg total) by mouth 2 (two) times daily. Take with food   fluticasone (FLONASE) 50 MCG/ACT nasal spray Place 2 sprays into both nostrils daily.   mometasone (ELOCON) 0.1 % cream    mupirocin ointment (BACTROBAN) 2 %    simvastatin (ZOCOR) 20 MG tablet TAKE 1 TABLET BY MOUTH AT BEDTIME   triamcinolone (KENALOG) 0.025 % cream Apply 1 application topically 2 (two) times daily.   [DISCONTINUED] ALPRAZolam (XANAX) 0.5 MG tablet Take 1 tablet (0.5 mg total) by mouth at bedtime as needed for anxiety.   ALPRAZolam (XANAX) 0.5 MG tablet Take 1 tablet (0.5 mg total) by mouth at bedtime as needed for anxiety.   No facility-administered encounter medications on file as of 09/13/2022.    Surgical History: Past Surgical History:  Procedure Laterality Date   CATARACT EXTRACTION, BILATERAL Bilateral 09/21,10/21   CESAREAN SECTION      Medical History: Past Medical History:  Diagnosis Date   Basal cell carcinoma 02/13/2010   Left mid  ant. thigh. Superficial.    Hx of basal cell carcinoma 02/02/2020   Left upper lip infranasal. Nodular pattern   Hyperlipidemia    Osteoporosis     Family History: Family History  Problem Relation Age of Onset   Breast cancer Maternal Aunt 55   Breast cancer Maternal Grandmother    Bladder Cancer Father    Prostate cancer Brother     Social History   Socioeconomic History   Marital status: Married    Spouse name: Not on file   Number of children: Not on file   Years of education: Not on file   Highest education level: Not on file  Occupational History   Not on file  Tobacco Use   Smoking status: Never   Smokeless tobacco: Never  Substance and Sexual Activity   Alcohol use: Yes    Comment: social    Drug use: Never   Sexual activity: Not on file  Other Topics Concern   Not on file  Social History Narrative   Not on file   Social Determinants of Health   Financial Resource Strain: Not on file  Food Insecurity: Not on file  Transportation Needs: Not on file  Physical Activity: Not on file  Stress: Not on file  Social Connections: Not on file  Intimate Partner Violence: Not on file      Review of Systems  Constitutional:  Positive for fatigue. Negative for chills and unexpected weight change.  HENT:  Positive for congestion, postnasal drip, rhinorrhea, sinus pressure, sinus pain and sore throat. Negative  for sneezing.   Eyes:  Negative for redness.  Respiratory:  Positive for cough and chest tightness. Negative for shortness of breath and wheezing.   Cardiovascular: Negative.  Negative for chest pain and palpitations.  Gastrointestinal: Negative.  Negative for abdominal pain, constipation, diarrhea, nausea and vomiting.  Genitourinary:  Negative for dysuria and frequency.  Musculoskeletal:  Negative for arthralgias, back pain, joint swelling and neck pain.  Skin:  Positive for color change (orange color on eye lids).  Neurological: Negative.  Negative for  tremors and numbness.  Hematological:  Negative for adenopathy. Does not bruise/bleed easily.  Psychiatric/Behavioral:  Negative for behavioral problems (Depression), sleep disturbance and suicidal ideas. The patient is not nervous/anxious.     Vital Signs: BP 114/70   Pulse 67   Temp (!) 97.1 F (36.2 C)   Resp 16   Ht _0  (1.499 m)   Wt 121 lb 3.2 oz (55 kg)   SpO2 99%   BMI 24.48 kg/m    Physical Exam Vitals reviewed.  Constitutional:      General: She is not in acute distress.    Appearance: Normal appearance. She is not ill-appearing.  HENT:     Head: Normocephalic and atraumatic.     Right Ear: Tympanic membrane, ear canal and external ear normal.     Left Ear: Tympanic membrane, ear canal and external ear normal.     Nose: Congestion and rhinorrhea present.     Mouth/Throat:     Pharynx: Posterior oropharyngeal erythema present.  Eyes:     Pupils: Pupils are equal, round, and reactive to light.  Cardiovascular:     Rate and Rhythm: Normal rate and regular rhythm.  Pulmonary:     Effort: Pulmonary effort is normal. No respiratory distress.  Skin:    Comments: Orange discoloration on eyelids  Neurological:     Mental Status: She is alert and oriented to person, place, and time.  Psychiatric:        Mood and Affect: Mood normal.        Behavior: Behavior normal.        Assessment/Plan: 1. Acute non-recurrent frontal sinusitis Empiric antibiotic treatment prescribed.  - doxycycline (VIBRA-TABS) 100 MG tablet; Take 1 tablet (100 mg total) by mouth 2 (two) times daily. Take with food  Dispense: 20 tablet; Refill: 0  2. Other fatigue Labs ordered for further evaluation  - B12 and Folate Panel - Iron, TIBC and Ferritin Panel - CBC with Differential/Platelet - CMP14+EGFR  3. B12 deficiency Labs ordered for further evaluation - B12 and Folate Panel - Iron, TIBC and Ferritin Panel - CBC with Differential/Platelet - CMP14+EGFR  4. Mixed  hyperlipidemia Repeat lipid panel - Lipid Profile  5. Medication refill - ALPRAZolam (XANAX) 0.5 MG tablet; Take 1 tablet (0.5 mg total) by mouth at bedtime as needed for anxiety.  Dispense: 30 tablet; Refill: 2   General Counseling: Khalila verbalizes understanding of the findings of todays visit and agrees with plan of treatment. I have discussed any further diagnostic evaluation that may be needed or ordered today. We also reviewed her medications today. she has been encouraged to call the office with any questions or concerns that should arise related to todays visit.    Orders Placed This Encounter  Procedures   B12 and Folate Panel   Iron, TIBC and Ferritin Panel   CBC with Differential/Platelet   CMP14+EGFR   Lipid Profile    Meds ordered this encounter  Medications   ALPRAZolam (  XANAX) 0.5 MG tablet    Sig: Take 1 tablet (0.5 mg total) by mouth at bedtime as needed for anxiety.    Dispense:  30 tablet    Refill:  2    For future refills, patient will call if she needs it.   doxycycline (VIBRA-TABS) 100 MG tablet    Sig: Take 1 tablet (100 mg total) by mouth 2 (two) times daily. Take with food    Dispense:  20 tablet    Refill:  0    Return for previously scheduled, CPE, Seryna Marek PCP in april 2024.   Total time spent:30 Minutes Time spent includes review of chart, medications, test results, and follow up plan with the patient.   [place] Controlled Substance Database was reviewed by me.  This patient was seen by Jonetta Osgood, FNP-C in collaboration with Dr. Clayborn Bigness as a part of collaborative care agreement.   Guerino Caporale R. Valetta Fuller, MSN, FNP-C Internal medicine

## 2022-09-13 NOTE — Telephone Encounter (Signed)
Provider told patient she will call with lab results. No need for follow up-Toni

## 2022-10-11 ENCOUNTER — Encounter: Payer: Self-pay | Admitting: Nurse Practitioner

## 2022-10-11 ENCOUNTER — Telehealth: Payer: BLUE CROSS/BLUE SHIELD | Admitting: Nurse Practitioner

## 2022-10-11 VITALS — Resp 16 | Ht 59.0 in | Wt 119.0 lb

## 2022-10-11 DIAGNOSIS — J0110 Acute frontal sinusitis, unspecified: Secondary | ICD-10-CM

## 2022-10-11 DIAGNOSIS — R051 Acute cough: Secondary | ICD-10-CM

## 2022-10-11 DIAGNOSIS — R062 Wheezing: Secondary | ICD-10-CM | POA: Diagnosis not present

## 2022-10-11 MED ORDER — DOXYCYCLINE HYCLATE 100 MG PO TABS: 100.0000 mg | ORAL_TABLET | Freq: Two times a day (BID) | ORAL | 0 refills | Status: DC

## 2022-10-11 MED ORDER — PREDNISONE 10 MG PO TABS: ORAL_TABLET | ORAL | 0 refills | Status: DC

## 2022-10-11 MED ORDER — HYDROCOD POLI-CHLORPHE POLI ER 10-8 MG/5ML PO SUER: 5.0000 mL | Freq: Two times a day (BID) | ORAL | 0 refills | Status: DC | PRN

## 2022-10-11 NOTE — Progress Notes (Signed)
[facility] [place], [place] [address]  Internal MEDICINE  Telephone Visit  Patient Name: Alison Oliver  [id number]  [id number]  Date of Service: 10/11/2022  I connected with the patient at 0815 by telephone and verified the patients identity using two identifiers.   I discussed the limitations, risks, security and privacy concerns of performing an evaluation and management service by telephone and the availability of in person appointments. I also discussed with the patient that there may be a patient responsible charge related to the service.  The patient expressed understanding and agrees to proceed.    Chief Complaint  Patient presents with   Telephone Screen   Telephone Assessment   Cough    Stop up, sick since Sunday.     HPI Alison Oliver presents for a telehealth virtual visit for symptoms of sinusitis.  Onset was Sunday Reports cough, nasal congestion, sinus pressure, runny nose, fatigue, SOB, wheezing, and chest tightness, and headaches.    Current Medication: Outpatient Encounter Medications as of 10/11/2022  Medication Sig   ALPRAZolam (XANAX) 0.5 MG tablet Take 1 tablet (0.5 mg total) by mouth at bedtime as needed for anxiety.   chlorpheniramine-HYDROcodone (TUSSIONEX) 10-8 MG/5ML Take 5 mLs by mouth every 12 (twelve) hours as needed for cough.   desoximetasone (TOPICORT) 0.25 % cream APPLY  CREAM EXTERNALLY TO AFFECTED AREA TWICE DAILY   doxycycline (VIBRA-TABS) 100 MG tablet Take 1 tablet (100 mg total) by mouth 2 (two) times daily.   fluticasone (FLONASE) 50 MCG/ACT nasal spray Place 2 sprays into both nostrils daily.   mometasone (ELOCON) 0.1 % cream    mupirocin ointment (BACTROBAN) 2 %    predniSONE (DELTASONE) 10 MG tablet Take one tab 3 x day for 3 days, then take one tab 2 x a day for 3 days and then take one tab a day for 3 days for copd   simvastatin (ZOCOR) 20 MG tablet TAKE 1 TABLET BY MOUTH AT BEDTIME   triamcinolone (KENALOG)  0.025 % cream Apply 1 application topically 2 (two) times daily.   [DISCONTINUED] doxycycline (VIBRA-TABS) 100 MG tablet Take 1 tablet (100 mg total) by mouth 2 (two) times daily. Take with food   No facility-administered encounter medications on file as of 10/11/2022.    Surgical History: Past Surgical History:  Procedure Laterality Date   CATARACT EXTRACTION, BILATERAL Bilateral 09/21,10/21   CESAREAN SECTION      Medical History: Past Medical History:  Diagnosis Date   Basal cell carcinoma 02/13/2010   Left mid ant. thigh. Superficial.    Hx of basal cell carcinoma 02/02/2020   Left upper lip infranasal. Nodular pattern   Hyperlipidemia    Osteoporosis     Family History: Family History  Problem Relation Age of Onset   Breast cancer Maternal Aunt 55   Breast cancer Maternal Grandmother    Bladder Cancer Father    Prostate cancer Brother     Social History   Socioeconomic History   Marital status: Married    Spouse name: Not on file   Number of children: Not on file   Years of education: Not on file   Highest education level: Not on file  Occupational History   Not on file  Tobacco Use   Smoking status: Never   Smokeless tobacco: Never  Substance and Sexual Activity   Alcohol use: Yes    Comment: social    Drug use: Never   Sexual activity: Not on file  Other  Topics Concern   Not on file  Social History Narrative   Not on file   Social Determinants of Health   Financial Resource Strain: Not on file  Food Insecurity: Not on file  Transportation Needs: Not on file  Physical Activity: Not on file  Stress: Not on file  Social Connections: Not on file  Intimate Partner Violence: Not on file      Review of Systems  Constitutional:  Positive for appetite change, chills and fatigue.  HENT:  Positive for congestion, facial swelling, postnasal drip, rhinorrhea, sinus pressure, sinus pain and sore throat.   Respiratory:  Positive for cough, chest  tightness, shortness of breath and wheezing.   Cardiovascular: Negative.  Negative for chest pain and palpitations.  Gastrointestinal:  Negative for diarrhea, nausea and vomiting.  Musculoskeletal:  Negative for myalgias.  Neurological:  Positive for headaches.    Vital Signs: Resp 16   Ht '4\' 11"'$  (1.499 m)   Wt 119 lb (54 kg)   BMI 24.04 kg/m    Observation/Objective: She is alert and oriented and engages in conversation appropriately. She does not sound as though she is in any acute distress over telephone call.     Assessment/Plan: 1. Acute non-recurrent frontal sinusitis Empiric antibiotic treatment prescribed.  - doxycycline (VIBRA-TABS) 100 MG tablet; Take 1 tablet (100 mg total) by mouth 2 (two) times daily.  Dispense: 20 tablet; Refill: 0  2. Acute cough Medication prescribed for symptomatic relief of cough  - chlorpheniramine-HYDROcodone (TUSSIONEX) 10-8 MG/5ML; Take 5 mLs by mouth every 12 (twelve) hours as needed for cough.  Dispense: 140 mL; Refill: 0  3. Wheezing Prednisone taper prescribed to decrease inflammation and wheezing.  - predniSONE (DELTASONE) 10 MG tablet; Take one tab 3 x day for 3 days, then take one tab 2 x a day for 3 days and then take one tab a day for 3 days for copd  Dispense: 18 tablet; Refill: 0   General Counseling: Alison Oliver verbalizes understanding of the findings of today's phone visit and agrees with plan of treatment. I have discussed any further diagnostic evaluation that may be needed or ordered today. We also reviewed her medications today. she has been encouraged to call the office with any questions or concerns that should arise related to todays visit.  Return if symptoms worsen or fail to improve.   No orders of the defined types were placed in this encounter.   Meds ordered this encounter  Medications   doxycycline (VIBRA-TABS) 100 MG tablet    Sig: Take 1 tablet (100 mg total) by mouth 2 (two) times daily.    Dispense:  20  tablet    Refill:  0   predniSONE (DELTASONE) 10 MG tablet    Sig: Take one tab 3 x day for 3 days, then take one tab 2 x a day for 3 days and then take one tab a day for 3 days for copd    Dispense:  18 tablet    Refill:  0   chlorpheniramine-HYDROcodone (TUSSIONEX) 10-8 MG/5ML    Sig: Take 5 mLs by mouth every 12 (twelve) hours as needed for cough.    Dispense:  140 mL    Refill:  0    Time spent:10 Minutes Time spent with patient included reviewing progress notes, labs, imaging studies, and discussing plan for follow up.  [place] Controlled Substance Database was reviewed by me for overdose risk score (ORS) if appropriate.  This patient was seen by Yetta Flock  Mckinsley Koelzer, FNP-C in collaboration with Dr. Clayborn Bigness as a part of collaborative care agreement.  Alison Bains R. Valetta Fuller, MSN, FNP-C Internal medicine

## 2022-11-24 LAB — LIPID PANEL
Chol/HDL Ratio: 6.1 ratio — ABNORMAL HIGH (ref 0.0–4.4)
Cholesterol, Total: 224 mg/dL — ABNORMAL HIGH (ref 100–199)
HDL: 37 mg/dL — ABNORMAL LOW (ref >39)
LDL Chol Calc (NIH): 148 mg/dL — ABNORMAL HIGH (ref 0–99)
Triglycerides: 215 mg/dL — ABNORMAL HIGH (ref 0–149)
VLDL Cholesterol Cal: 39 mg/dL (ref 5–40)

## 2022-11-24 LAB — B12 AND FOLATE PANEL
Folate: >20 ng/mL (ref >3.0)
Vitamin B-12: 1520 pg/mL — ABNORMAL HIGH (ref 232–1245)

## 2022-11-24 LAB — CBC WITH DIFFERENTIAL/PLATELET
Basophils Absolute: 0.1 10*3/uL (ref 0.0–0.2)
Basos: 1 %
EOS (ABSOLUTE): 0.2 10*3/uL (ref 0.0–0.4)
Eos: 3 %
Hematocrit: 40.5 % (ref 34.0–46.6)
Hemoglobin: 13.9 g/dL (ref 11.1–15.9)
Immature Grans (Abs): 0 10*3/uL (ref 0.0–0.1)
Immature Granulocytes: 0 %
Lymphocytes Absolute: 2.2 10*3/uL (ref 0.7–3.1)
Lymphs: 35 %
MCH: 30.5 pg (ref 26.6–33.0)
MCHC: 34.3 g/dL (ref 31.5–35.7)
MCV: 89 fL (ref 79–97)
Monocytes Absolute: 0.6 10*3/uL (ref 0.1–0.9)
Monocytes: 9 %
Neutrophils Absolute: 3.3 10*3/uL (ref 1.4–7.0)
Neutrophils: 52 %
Platelets: 299 10*3/uL (ref 150–450)
RBC: 4.56 x10E6/uL (ref 3.77–5.28)
RDW: 12.4 % (ref 11.7–15.4)
WBC: 6.3 10*3/uL (ref 3.4–10.8)

## 2022-11-24 LAB — CMP14+EGFR
ALT: 24 IU/L (ref 0–32)
AST: 27 IU/L (ref 0–40)
Albumin/Globulin Ratio: 2 (ref 1.2–2.2)
Albumin: 4.4 g/dL (ref 3.9–4.9)
Alkaline Phosphatase: 103 IU/L (ref 44–121)
BUN/Creatinine Ratio: 32 — ABNORMAL HIGH (ref 12–28)
BUN: 21 mg/dL (ref 8–27)
Bilirubin Total: 0.5 mg/dL (ref 0.0–1.2)
CO2: 24 mmol/L (ref 20–29)
Calcium: 9.7 mg/dL (ref 8.7–10.3)
Chloride: 100 mmol/L (ref 96–106)
Creatinine, Ser: 0.65 mg/dL (ref 0.57–1.00)
Globulin, Total: 2.2 g/dL (ref 1.5–4.5)
Glucose: 91 mg/dL (ref 70–99)
Potassium: 4.3 mmol/L (ref 3.5–5.2)
Sodium: 137 mmol/L (ref 134–144)
Total Protein: 6.6 g/dL (ref 6.0–8.5)
eGFR: 99 mL/min/{1.73_m2} (ref >59)

## 2022-11-24 LAB — IRON,TIBC AND FERRITIN PANEL
Ferritin: 80 ng/mL (ref 15–150)
Iron Saturation: 25 % (ref 15–55)
Iron: 78 ug/dL (ref 27–139)
Total Iron Binding Capacity: 313 ug/dL (ref 250–450)
UIBC: 235 ug/dL (ref 118–369)

## 2023-01-02 ENCOUNTER — Telehealth: Payer: Self-pay

## 2023-01-02 NOTE — Telephone Encounter (Signed)
Pt.notified

## 2023-01-02 NOTE — Telephone Encounter (Signed)
Left message for patient to give office a call.  

## 2023-01-02 NOTE — Progress Notes (Signed)
Please let patient know All labs are normal except cholesterol panel which has become significantly worse.  She is on simvastatin, has she been taking it consistently? If she has been, then we may need to increase the dose or switch to a stronger statin.

## 2023-01-02 NOTE — Telephone Encounter (Signed)
-----   Message from Jonetta Osgood, NP sent at 01/02/2023  9:59 AM EST ----- Lets stay with the simvastatin since she just started taking it again and we can repeat the cholesterol panel in 3 months.

## 2023-01-09 ENCOUNTER — Encounter: Payer: Self-pay | Admitting: Nurse Practitioner

## 2023-01-09 ENCOUNTER — Telehealth: Payer: BLUE CROSS/BLUE SHIELD | Admitting: Nurse Practitioner

## 2023-01-09 ENCOUNTER — Other Ambulatory Visit: Payer: Self-pay | Admitting: Nurse Practitioner

## 2023-01-09 VITALS — Resp 16 | Ht 59.0 in | Wt 119.0 lb

## 2023-01-09 DIAGNOSIS — J0110 Acute frontal sinusitis, unspecified: Secondary | ICD-10-CM

## 2023-01-09 DIAGNOSIS — Z1231 Encounter for screening mammogram for malignant neoplasm of breast: Secondary | ICD-10-CM

## 2023-01-09 MED ORDER — DOXYCYCLINE HYCLATE 100 MG PO TABS: 100.0000 mg | ORAL_TABLET | Freq: Two times a day (BID) | ORAL | 0 refills | Status: DC

## 2023-01-09 NOTE — Progress Notes (Signed)
[facility] [place], [place] [address]  Internal MEDICINE  Telephone Visit  Patient Name: Alison Oliver  [id number]  [id number]  Date of Service: 01/09/2023  I connected with the patient at 1515 by telephone and verified the patients identity using two identifiers.   I discussed the limitations, risks, security and privacy concerns of performing an evaluation and management service by telephone and the availability of in person appointments. I also discussed with the patient that there may be a patient responsible charge related to the service.  The patient expressed understanding and agrees to proceed.    Chief Complaint  Patient presents with   Telephone Screen    Possible sinus infection. Covid test negative   Telephone Assessment    HPI Alison Oliver presents for a telehealth virtual visit for possible sinus infection Negative for covid Onset of symptoms was 3-4 days ago.  --reports nasal congestion, sinus pain/pressure, sore throat, cough, headache, fatigue    Current Medication: Outpatient Encounter Medications as of 01/09/2023  Medication Sig   ALPRAZolam (XANAX) 0.5 MG tablet Take 1 tablet (0.5 mg total) by mouth at bedtime as needed for anxiety.   chlorpheniramine-HYDROcodone (TUSSIONEX) 10-8 MG/5ML Take 5 mLs by mouth every 12 (twelve) hours as needed for cough.   desoximetasone (TOPICORT) 0.25 % cream APPLY  CREAM EXTERNALLY TO AFFECTED AREA TWICE DAILY   doxycycline (VIBRA-TABS) 100 MG tablet Take 1 tablet (100 mg total) by mouth 2 (two) times daily. Take with food   fluticasone (FLONASE) 50 MCG/ACT nasal spray Place 2 sprays into both nostrils daily.   mometasone (ELOCON) 0.1 % cream    mupirocin ointment (BACTROBAN) 2 %    predniSONE (DELTASONE) 10 MG tablet Take one tab 3 x day for 3 days, then take one tab 2 x a day for 3 days and then take one tab a day for 3 days for copd   simvastatin (ZOCOR) 20 MG tablet TAKE 1 TABLET BY MOUTH AT BEDTIME    triamcinolone (KENALOG) 0.025 % cream Apply 1 application topically 2 (two) times daily.   [DISCONTINUED] doxycycline (VIBRA-TABS) 100 MG tablet Take 1 tablet (100 mg total) by mouth 2 (two) times daily.   No facility-administered encounter medications on file as of 01/09/2023.    Surgical History: Past Surgical History:  Procedure Laterality Date   CATARACT EXTRACTION, BILATERAL Bilateral 09/21,10/21   CESAREAN SECTION      Medical History: Past Medical History:  Diagnosis Date   Basal cell carcinoma 02/13/2010   Left mid ant. thigh. Superficial.    Hx of basal cell carcinoma 02/02/2020   Left upper lip infranasal. Nodular pattern   Hyperlipidemia    Osteoporosis     Family History: Family History  Problem Relation Age of Onset   Breast cancer Maternal Aunt 55   Breast cancer Maternal Grandmother    Bladder Cancer Father    Prostate cancer Brother     Social History   Socioeconomic History   Marital status: Married    Spouse name: Not on file   Number of children: Not on file   Years of education: Not on file   Highest education level: Not on file  Occupational History   Not on file  Tobacco Use   Smoking status: Never   Smokeless tobacco: Never  Substance and Sexual Activity   Alcohol use: Yes    Comment: social    Drug use: Never   Sexual activity: Not on file  Other Topics Concern  Not on file  Social History Narrative   Not on file   Social Determinants of Health   Financial Resource Strain: Not on file  Food Insecurity: Not on file  Transportation Needs: Not on file  Physical Activity: Not on file  Stress: Not on file  Social Connections: Not on file  Intimate Partner Violence: Not on file      Review of Systems  Constitutional:  Positive for chills, fatigue and fever.  HENT:  Positive for congestion, postnasal drip, rhinorrhea, sinus pressure, sinus pain and sore throat.   Respiratory:  Positive for cough and chest tightness. Negative  for shortness of breath and wheezing.   Cardiovascular: Negative.  Negative for chest pain and palpitations.  Gastrointestinal: Negative.   Neurological:  Positive for headaches.    Vital Signs: Resp 16   Ht 4' 11"$  (1.499 m)   Wt 119 lb (54 kg)   BMI 24.04 kg/m    Observation/Objective: She is alert and oriented and engages in conversation appropriately. No acute distress noted.     Assessment/Plan: 1. Acute non-recurrent frontal sinusitis Empiric antibiotic treatment prescribed.  - doxycycline (VIBRA-TABS) 100 MG tablet; Take 1 tablet (100 mg total) by mouth 2 (two) times daily. Take with food  Dispense: 20 tablet; Refill: 0   General Counseling: Alison Oliver verbalizes understanding of the findings of today's phone visit and agrees with plan of treatment. I have discussed any further diagnostic evaluation that may be needed or ordered today. We also reviewed her medications today. she has been encouraged to call the office with any questions or concerns that should arise related to todays visit.  Return if symptoms worsen or fail to improve.   No orders of the defined types were placed in this encounter.   Meds ordered this encounter  Medications   doxycycline (VIBRA-TABS) 100 MG tablet    Sig: Take 1 tablet (100 mg total) by mouth 2 (two) times daily. Take with food    Dispense:  20 tablet    Refill:  0    Time spent:10 Minutes Time spent with patient included reviewing progress notes, labs, imaging studies, and discussing plan for follow up.  [place] Controlled Substance Database was reviewed by me for overdose risk score (ORS) if appropriate.  This patient was seen by Jonetta Osgood, FNP-C in collaboration with Dr. Clayborn Bigness as a part of collaborative care agreement.  Saifullah Jolley R. Valetta Fuller, MSN, FNP-C Internal medicine

## 2023-01-22 ENCOUNTER — Ambulatory Visit
Admission: RE | Admit: 2023-01-22 | Discharge: 2023-01-22 | Disposition: A | Discharge status: Home / Self Care | Payer: BLUE CROSS/BLUE SHIELD | Source: Ambulatory Visit | Attending: Nurse Practitioner | Admitting: Nurse Practitioner

## 2023-01-22 DIAGNOSIS — Z1231 Encounter for screening mammogram for malignant neoplasm of breast: Secondary | ICD-10-CM | POA: Diagnosis present

## 2023-03-19 ENCOUNTER — Encounter: Payer: BLUE CROSS/BLUE SHIELD | Admitting: Nurse Practitioner

## 2023-04-26 ENCOUNTER — Ambulatory Visit (INDEPENDENT_AMBULATORY_CARE_PROVIDER_SITE_OTHER): Payer: BLUE CROSS/BLUE SHIELD | Admitting: Nurse Practitioner

## 2023-04-26 ENCOUNTER — Encounter: Payer: Self-pay | Admitting: Nurse Practitioner

## 2023-04-26 VITALS — BP 120/70 | HR 64 | Temp 98.4°F | Resp 16 | Ht 59.0 in | Wt 119.6 lb

## 2023-04-26 DIAGNOSIS — E782 Mixed hyperlipidemia: Secondary | ICD-10-CM

## 2023-04-26 DIAGNOSIS — R140 Abdominal distension (gaseous): Secondary | ICD-10-CM

## 2023-04-26 DIAGNOSIS — Z0001 Encounter for general adult medical examination with abnormal findings: Secondary | ICD-10-CM | POA: Diagnosis not present

## 2023-04-26 DIAGNOSIS — Z79899 Other long term (current) drug therapy: Secondary | ICD-10-CM | POA: Diagnosis not present

## 2023-04-26 DIAGNOSIS — R300 Dysuria: Secondary | ICD-10-CM

## 2023-04-26 DIAGNOSIS — R109 Unspecified abdominal pain: Secondary | ICD-10-CM

## 2023-04-26 MED ORDER — DESOXIMETASONE 0.25 % EX CREA: TOPICAL_CREAM | CUTANEOUS | 0 refills | Status: AC

## 2023-04-26 MED ORDER — ALPRAZOLAM 0.5 MG PO TABS: 0.5000 mg | ORAL_TABLET | Freq: Every evening | ORAL | 2 refills | Status: AC | PRN

## 2023-04-26 MED ORDER — FLUTICASONE PROPIONATE 50 MCG/ACT NA SUSP: 2.0000 | Freq: Every day | NASAL | 6 refills | Status: AC

## 2023-04-26 MED ORDER — HYOSCYAMINE SULFATE 0.125 MG PO TBDP: 0.1250 mg | ORAL_TABLET | Freq: Four times a day (QID) | ORAL | 2 refills | Status: DC | PRN

## 2023-04-26 MED ORDER — SIMVASTATIN 20 MG PO TABS: 20.0000 mg | ORAL_TABLET | Freq: Every day | ORAL | 3 refills | Status: DC

## 2023-04-26 NOTE — Progress Notes (Signed)
[facility] [address] [place], [place] [address]  Internal MEDICINE  Office Visit Note  Patient Name: Alison Oliver  [id number]  [id number]  Date of Service: 04/26/2023  Chief Complaint  Patient presents with   Hyperlipidemia   Annual Exam    HPI Alison Oliver presents for an annual well visit and physical exam.  Well-appearing 62 y.o. female with hyperlipidemia, arthritis, and generalized anxiety disorder.  Routine CRC screening: due in 2026 Routine mammogram: done February 2024 Pap smear: not due until 2025 Labs: just needs cholesterol panel repeated New or worsening pain: none Episodes of nausea randomly, not specifically happening at certain, not tried any new supplements. Happens along with bloating and abdominal cramping off and on.  Orange eyelids? Takes multiple vitamin, vitamin B12 and vitamin D, vitamin C Increased stress from work and personal life, grandson fell and broke his arm.    Current Medication: Outpatient Encounter Medications as of 04/26/2023  Medication Sig   hyoscyamine (ANASPAZ) 0.125 MG TBDP disintergrating tablet Place 1 tablet (0.125 mg total) under the tongue every 6 (six) hours as needed for cramping.   triamcinolone (KENALOG) 0.025 % cream Apply 1 application topically 2 (two) times daily.   [DISCONTINUED] ALPRAZolam (XANAX) 0.5 MG tablet Take 1 tablet (0.5 mg total) by mouth at bedtime as needed for anxiety.   [DISCONTINUED] chlorpheniramine-HYDROcodone (TUSSIONEX) 10-8 MG/5ML Take 5 mLs by mouth every 12 (twelve) hours as needed for cough.   [DISCONTINUED] desoximetasone (TOPICORT) 0.25 % cream APPLY  CREAM EXTERNALLY TO AFFECTED AREA TWICE DAILY   [DISCONTINUED] doxycycline (VIBRA-TABS) 100 MG tablet Take 1 tablet (100 mg total) by mouth 2 (two) times daily. Take with food   [DISCONTINUED] fluticasone (FLONASE) 50 MCG/ACT nasal spray Place 2 sprays into both nostrils daily.   [DISCONTINUED] mometasone (ELOCON) 0.1 % cream     [DISCONTINUED] mupirocin ointment (BACTROBAN) 2 %    [DISCONTINUED] predniSONE (DELTASONE) 10 MG tablet Take one tab 3 x day for 3 days, then take one tab 2 x a day for 3 days and then take one tab a day for 3 days for copd   [DISCONTINUED] simvastatin (ZOCOR) 20 MG tablet TAKE 1 TABLET BY MOUTH AT BEDTIME   ALPRAZolam (XANAX) 0.5 MG tablet Take 1 tablet (0.5 mg total) by mouth at bedtime as needed for anxiety.   desoximetasone (TOPICORT) 0.25 % cream APPLY  CREAM EXTERNALLY TO AFFECTED AREA TWICE DAILY   fluticasone (FLONASE) 50 MCG/ACT nasal spray Place 2 sprays into both nostrils daily.   simvastatin (ZOCOR) 20 MG tablet Take 1 tablet (20 mg total) by mouth at bedtime.   No facility-administered encounter medications on file as of 04/26/2023.    Surgical History: Past Surgical History:  Procedure Laterality Date   CATARACT EXTRACTION, BILATERAL Bilateral 09/21,10/21   CESAREAN SECTION      Medical History: Past Medical History:  Diagnosis Date   Basal cell carcinoma 02/13/2010   Left mid ant. thigh. Superficial.    Hx of basal cell carcinoma 02/02/2020   Left upper lip infranasal. Nodular pattern   Hyperlipidemia    Osteoporosis     Family History: Family History  Problem Relation Age of Onset   Breast cancer Maternal Aunt 55   Breast cancer Maternal Grandmother    Bladder Cancer Father    Prostate cancer Brother     Social History   Socioeconomic History   Marital status: Married    Spouse name: Not on file   Number of children: Not on  file   Years of education: Not on file   Highest education level: Not on file  Occupational History   Not on file  Tobacco Use   Smoking status: Never   Smokeless tobacco: Never  Substance and Sexual Activity   Alcohol use: Yes    Comment: social    Drug use: Never   Sexual activity: Not on file  Other Topics Concern   Not on file  Social History Narrative   Not on file   Social Determinants of Health   Financial  Resource Strain: Not on file  Food Insecurity: Not on file  Transportation Needs: Not on file  Physical Activity: Not on file  Stress: Not on file  Social Connections: Not on file  Intimate Partner Violence: Not on file      Review of Systems  Constitutional:  Positive for fatigue. Negative for activity change, appetite change, chills, fever and unexpected weight change.  HENT: Negative.  Negative for congestion, ear pain, rhinorrhea, sore throat and trouble swallowing.   Eyes: Negative.   Respiratory: Negative.  Negative for cough, chest tightness, shortness of breath and wheezing.   Cardiovascular: Negative.  Negative for chest pain and palpitations.  Gastrointestinal:  Positive for abdominal distention, abdominal pain, constipation and nausea. Negative for blood in stool, diarrhea and vomiting.  Endocrine: Negative.   Genitourinary: Negative.  Negative for difficulty urinating, dysuria, frequency, hematuria, urgency, vaginal bleeding, vaginal discharge and vaginal pain.  Musculoskeletal:  Negative for arthralgias, back pain, joint swelling, myalgias and neck pain.  Skin: Negative.  Negative for rash and wound.  Allergic/Immunologic: Negative.  Negative for immunocompromised state.  Neurological: Negative.  Negative for dizziness, seizures, numbness and headaches.  Hematological: Negative.   Psychiatric/Behavioral: Negative.  Negative for behavioral problems, self-injury and suicidal ideas. The patient is not nervous/anxious.     Vital Signs: BP 120/70   Pulse 64   Temp 98.4 F (36.9 C)   Resp 16   Ht 4\' 11"  (1.499 m)   Wt 119 lb 9.6 oz (54.3 kg)   SpO2 99%   BMI 24.16 kg/m    Physical Exam Vitals reviewed.  Constitutional:      General: She is awake. She is not in acute distress.    Appearance: Normal appearance. She is well-developed, well-groomed and normal weight. She is not ill-appearing or diaphoretic.  HENT:     Head: Normocephalic and atraumatic.     Right  Ear: Tympanic membrane, ear canal and external ear normal.     Left Ear: Tympanic membrane, ear canal and external ear normal.     Nose: Nose normal. No congestion or rhinorrhea.     Mouth/Throat:     Lips: Pink.     Mouth: Mucous membranes are moist.     Pharynx: Oropharynx is clear. Uvula midline. No oropharyngeal exudate or posterior oropharyngeal erythema.  Eyes:     General: Lids are normal. Vision grossly intact. Gaze aligned appropriately. No scleral icterus.       Right eye: No discharge.        Left eye: No discharge.     Extraocular Movements: Extraocular movements intact.     Conjunctiva/sclera: Conjunctivae normal.     Pupils: Pupils are equal, round, and reactive to light.     Funduscopic exam:    Right eye: Red reflex present.        Left eye: Red reflex present. Neck:     Thyroid: No thyromegaly.     Vascular: No carotid  bruit or JVD.     Trachea: Trachea and phonation normal. No tracheal deviation.  Cardiovascular:     Rate and Rhythm: Normal rate and regular rhythm.     Pulses: Normal pulses.     Heart sounds: Normal heart sounds, S1 normal and S2 normal. No murmur heard.    No friction rub. No gallop.  Pulmonary:     Effort: Pulmonary effort is normal. No accessory muscle usage or respiratory distress.     Breath sounds: Normal breath sounds and air entry. No stridor. No wheezing or rales.  Chest:     Chest wall: No tenderness.     Comments: Declined clinical breast exam, gets annual mammograms. Abdominal:     General: Bowel sounds are normal. There is no distension.     Palpations: Abdomen is soft. There is no shifting dullness, fluid wave, mass or pulsatile mass.     Tenderness: There is no abdominal tenderness. There is no guarding or rebound.  Musculoskeletal:        General: No tenderness or deformity. Normal range of motion.     Cervical back: Normal range of motion and neck supple.     Right lower leg: No edema.     Left lower leg: No edema.   Lymphadenopathy:     Cervical: No cervical adenopathy.  Skin:    General: Skin is warm and dry.     Capillary Refill: Capillary refill takes less than 2 seconds.     Coloration: Skin is not pale.     Findings: No erythema or rash.  Neurological:     Mental Status: She is alert and oriented to person, place, and time.     Cranial Nerves: No cranial nerve deficit.     Motor: No abnormal muscle tone.     Coordination: Coordination normal.     Gait: Gait normal.     Deep Tendon Reflexes: Reflexes are normal and symmetric.  Psychiatric:        Mood and Affect: Mood normal.        Behavior: Behavior normal. Behavior is cooperative.        Thought Content: Thought content normal.        Judgment: Judgment normal.        Assessment/Plan: 1. Encounter for routine adult health examination with abnormal findings Age-appropriate preventive screenings and vaccinations discussed, annual physical exam completed. Routine labs for health maintenance ordered, see below. PHM updated.   2. Abdominal bloating with cramps Try hyoscyamine for bloating and cramping - hyoscyamine (ANASPAZ) 0.125 MG TBDP disintergrating tablet; Place 1 tablet (0.125 mg total) under the tongue every 6 (six) hours as needed for cramping.  Dispense: 60 tablet; Refill: 2  3. Mixed hyperlipidemia Continue simvastatin as prescribed, repeat lipid panel - Lipid Profile - simvastatin (ZOCOR) 20 MG tablet; Take 1 tablet (20 mg total) by mouth at bedtime.  Dispense: 90 tablet; Refill: 3  4. Dysuria Routine urinalysis done - UA/M w/rflx Culture, Routine  5. Encounter for medication review Medication list reviewed, updated and refills ordered - ALPRAZolam (XANAX) 0.5 MG tablet; Take 1 tablet (0.5 mg total) by mouth at bedtime as needed for anxiety.  Dispense: 30 tablet; Refill: 2 - desoximetasone (TOPICORT) 0.25 % cream; APPLY  CREAM EXTERNALLY TO AFFECTED AREA TWICE DAILY  Dispense: 60 g; Refill: 0 - fluticasone (FLONASE)  50 MCG/ACT nasal spray; Place 2 sprays into both nostrils daily.  Dispense: 16 g; Refill: 6     General Counseling: Alison Oliver verbalizes  understanding of the findings of todays visit and agrees with plan of treatment. I have discussed any further diagnostic evaluation that may be needed or ordered today. We also reviewed her medications today. she has been encouraged to call the office with any questions or concerns that should arise related to todays visit.    Orders Placed This Encounter  Procedures   Lipid Profile    Meds ordered this encounter  Medications   ALPRAZolam (XANAX) 0.5 MG tablet    Sig: Take 1 tablet (0.5 mg total) by mouth at bedtime as needed for anxiety.    Dispense:  30 tablet    Refill:  2    For future refills, patient will call if she needs it.   desoximetasone (TOPICORT) 0.25 % cream    Sig: APPLY  CREAM EXTERNALLY TO AFFECTED AREA TWICE DAILY    Dispense:  60 g    Refill:  0   fluticasone (FLONASE) 50 MCG/ACT nasal spray    Sig: Place 2 sprays into both nostrils daily.    Dispense:  16 g    Refill:  6   simvastatin (ZOCOR) 20 MG tablet    Sig: Take 1 tablet (20 mg total) by mouth at bedtime.    Dispense:  90 tablet    Refill:  3   hyoscyamine (ANASPAZ) 0.125 MG TBDP disintergrating tablet    Sig: Place 1 tablet (0.125 mg total) under the tongue every 6 (six) hours as needed for cramping.    Dispense:  60 tablet    Refill:  2    Fill today    Return in about 1 year (around 04/25/2024) for CPE, Jora Galluzzo PCP and otherwise as needed. .   Total time spent:30 Minutes Time spent includes review of chart, medications, test results, and follow up plan with the patient.   [place] Controlled Substance Database was reviewed by me.  This patient was seen by Sallyanne Kuster, FNP-C in collaboration with Dr. Beverely Risen as a part of collaborative care agreement.  Aveya Beal R. Tedd Sias, MSN, FNP-C Internal medicine

## 2023-04-27 LAB — UA/M W/RFLX CULTURE, ROUTINE
Bilirubin, UA: NEGATIVE
Glucose, UA: NEGATIVE
Ketones, UA: NEGATIVE
Leukocytes,UA: NEGATIVE
Nitrite, UA: NEGATIVE
Protein,UA: NEGATIVE
RBC, UA: NEGATIVE
Specific Gravity, UA: 1.019 (ref 1.005–1.030)
Urobilinogen, Ur: 0.2 mg/dL (ref 0.2–1.0)
pH, UA: 7.5 (ref 5.0–7.5)

## 2023-04-27 LAB — MICROSCOPIC EXAMINATION
Bacteria, UA: NONE SEEN
Casts: NONE SEEN /lpf
Epithelial Cells (non renal): NONE SEEN /hpf (ref 0–10)

## 2023-05-30 IMAGING — MG MM DIGITAL SCREENING BILAT W/ TOMO AND CAD
8 series · 9 of 24 positions shown · non-contrast
Comparison: Previous exam(s).

CLINICAL DATA: Screening.

EXAM:
DIGITAL SCREENING BILATERAL MAMMOGRAM WITH TOMOSYNTHESIS AND CAD
TECHNIQUE: Bilateral screening digital craniocaudal and mediolateral oblique
mammograms were obtained. Bilateral screening digital breast
tomosynthesis was performed. The images were evaluated with
computer-aided detection.

[L CC synth-2D]
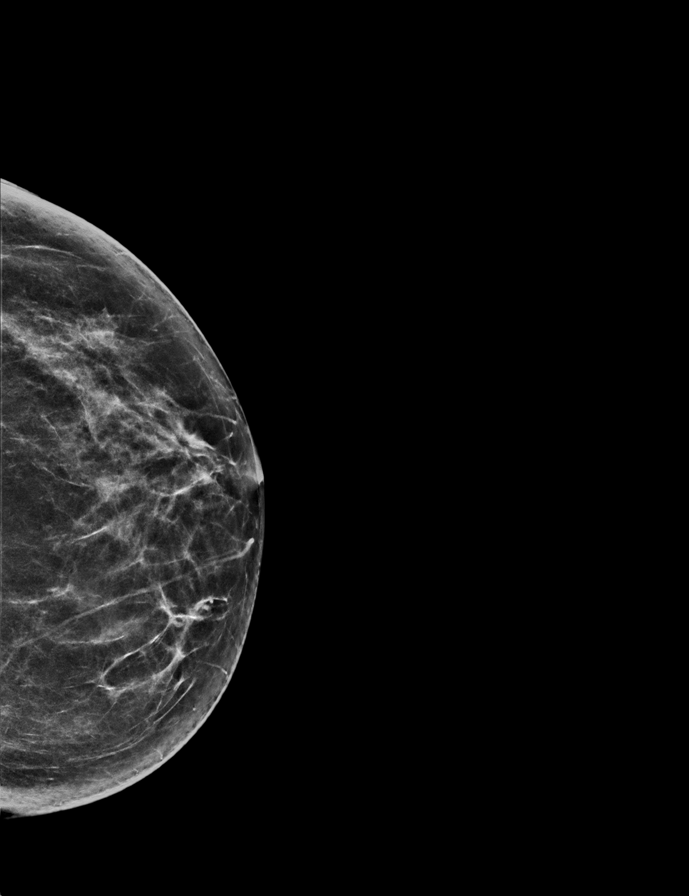

[L MLO synth-2D]
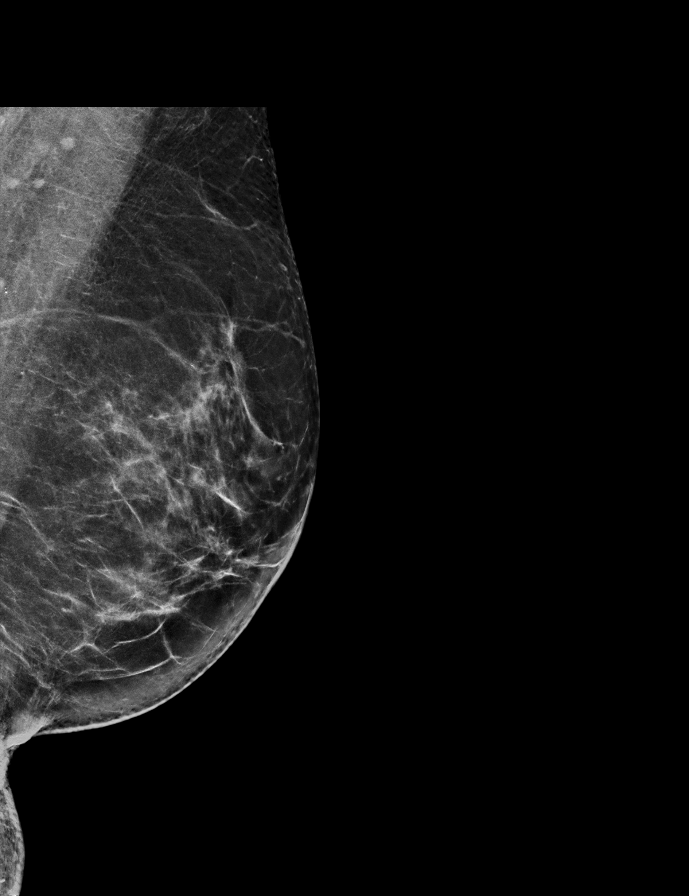

[R CC synth-2D]
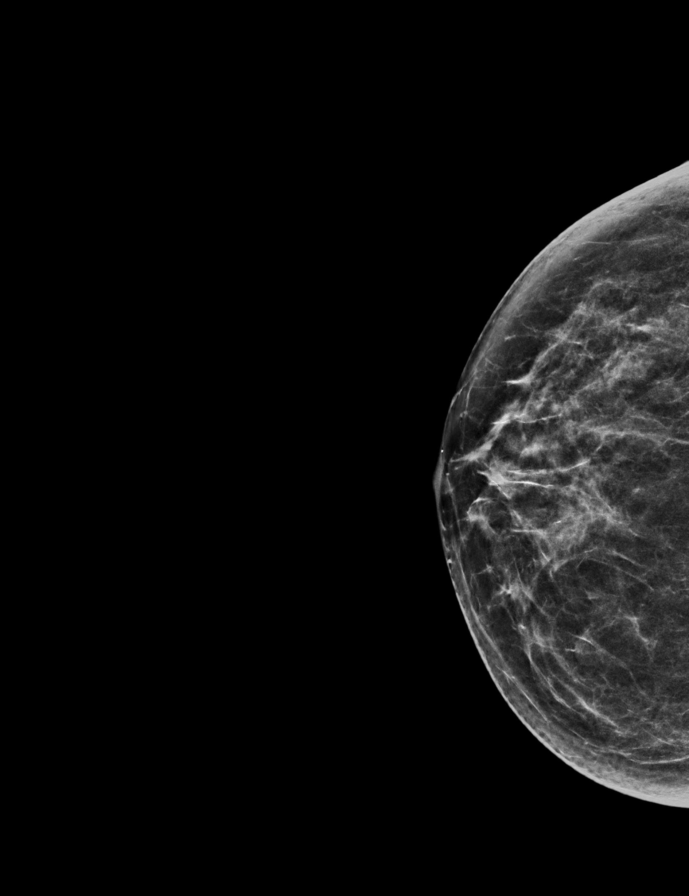

[R MLO synth-2D]
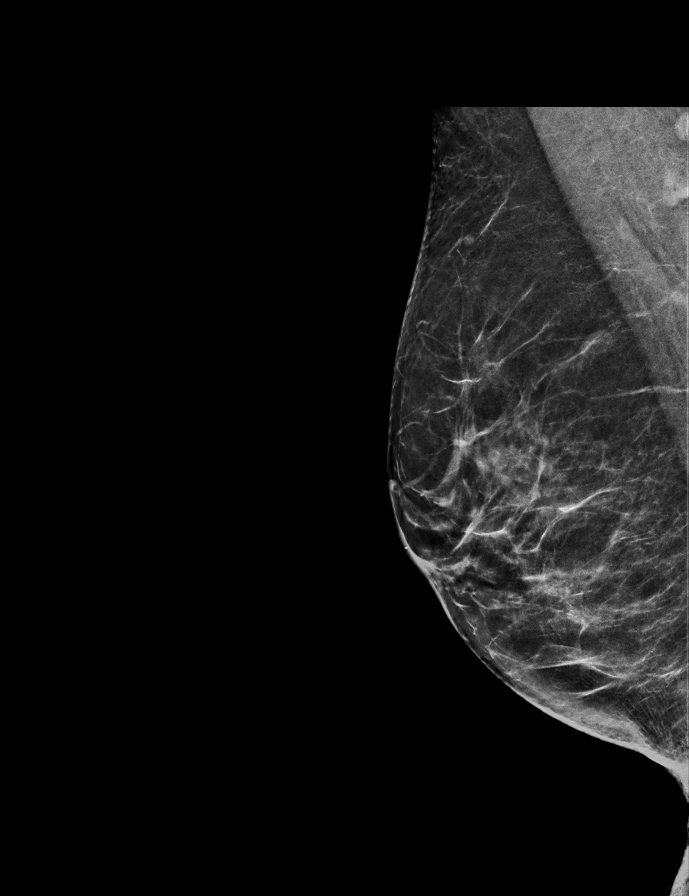

[L CC tomo · 2 of 60 frames shown]
[frame 20/60]
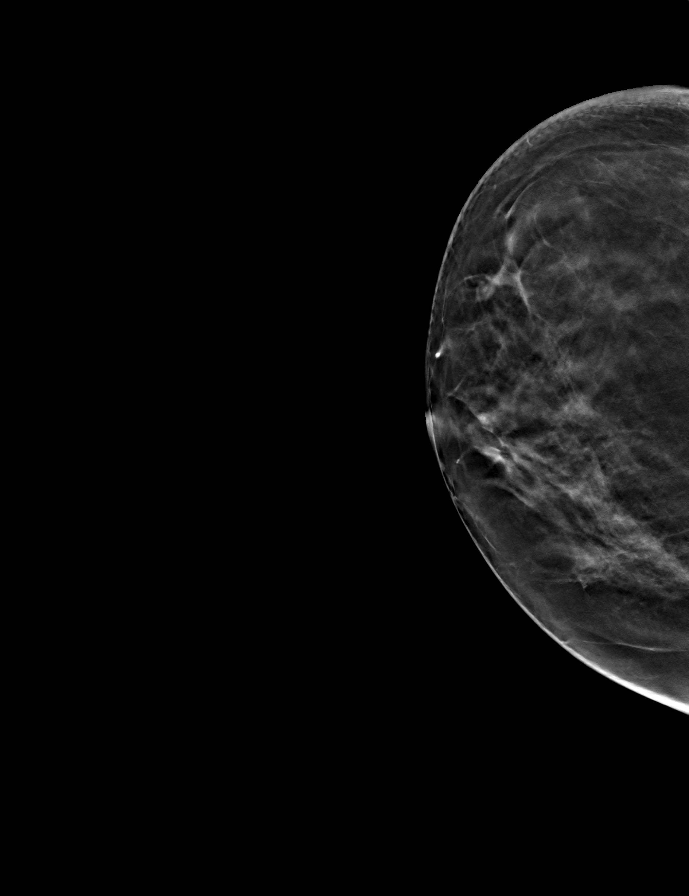
[frame 31/60]
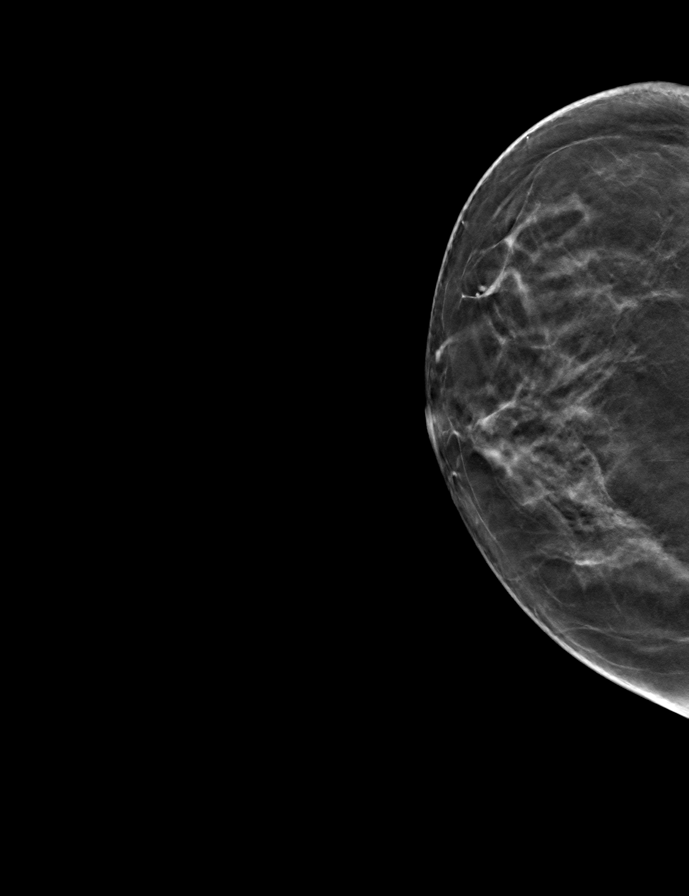

[R CC tomo · tomo slice 31/60.0]
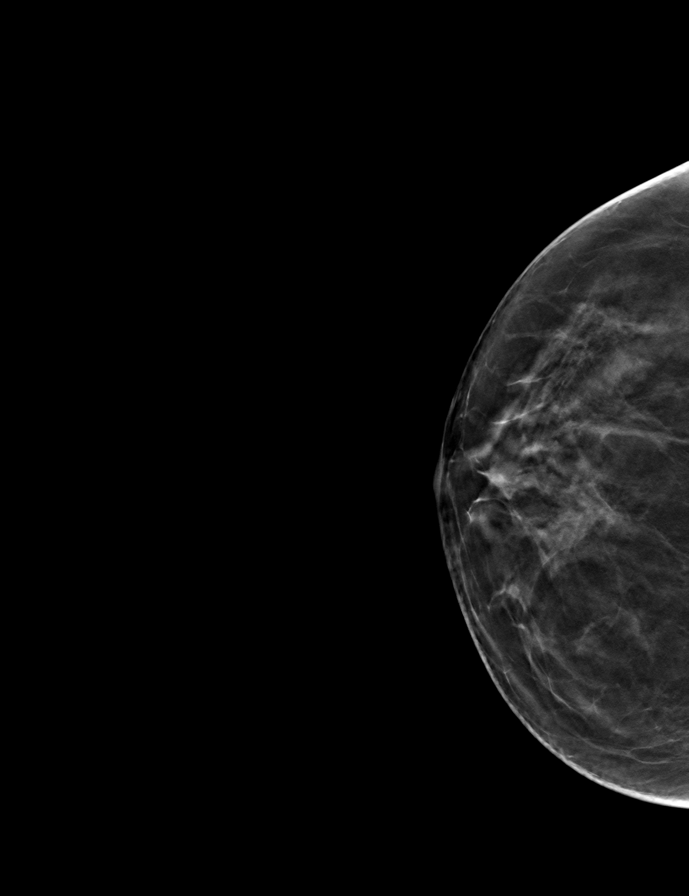

[R MLO tomo · tomo slice 33/65.0]
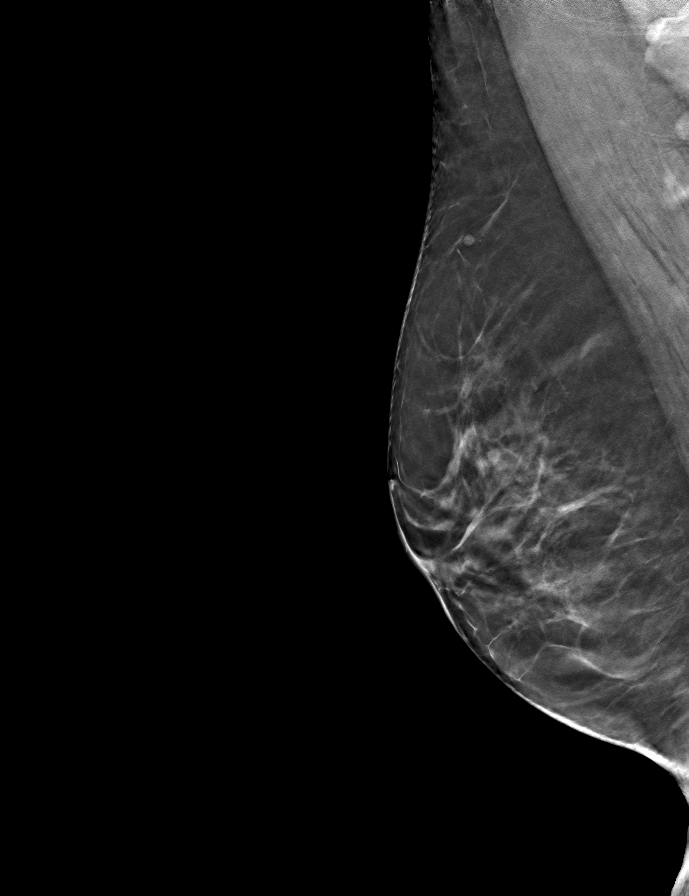

[L MLO tomo · tomo slice 35/69.0]
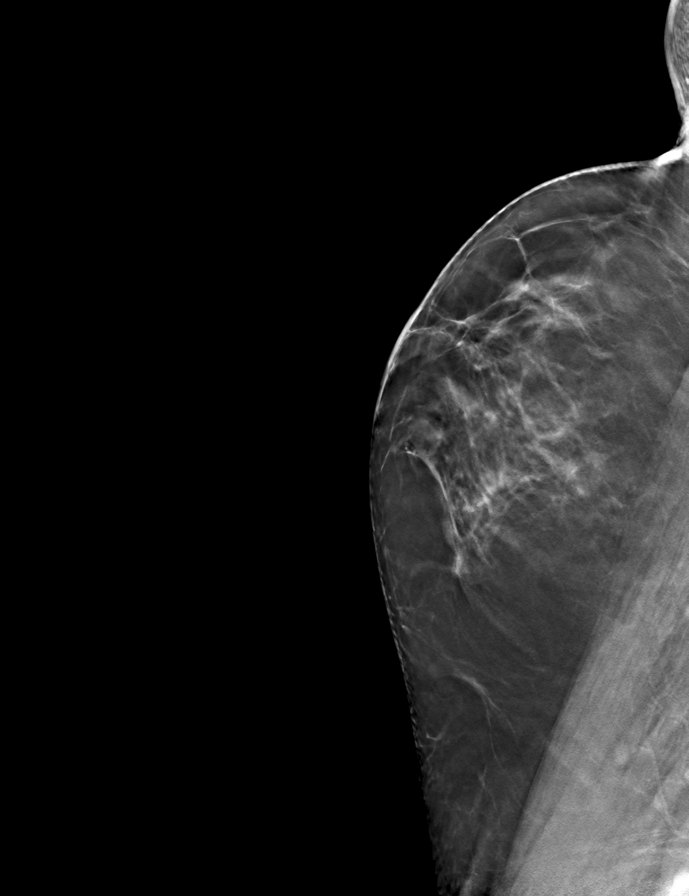

[9 of 24 positions shown; findings below may reference images not displayed]

ACR Breast Density Category b: There are scattered areas of
fibroglandular density.
FINDINGS: There are no findings suspicious for malignancy.
IMPRESSION: No mammographic evidence of malignancy. A result letter of this
screening mammogram will be mailed directly to the patient.

RECOMMENDATION:
Screening mammogram in one year. (Code:51-O-LD2)

BI-RADS CATEGORY  1: Negative.

## 2023-08-15 ENCOUNTER — Encounter: Payer: Self-pay | Admitting: Dermatology

## 2023-08-15 ENCOUNTER — Ambulatory Visit: Payer: BLUE CROSS/BLUE SHIELD | Admitting: Dermatology

## 2023-08-15 VITALS — BP 117/74 | HR 64

## 2023-08-15 DIAGNOSIS — L821 Other seborrheic keratosis: Secondary | ICD-10-CM

## 2023-08-15 DIAGNOSIS — Z872 Personal history of diseases of the skin and subcutaneous tissue: Secondary | ICD-10-CM

## 2023-08-15 DIAGNOSIS — Z1283 Encounter for screening for malignant neoplasm of skin: Secondary | ICD-10-CM | POA: Diagnosis not present

## 2023-08-15 DIAGNOSIS — D239 Other benign neoplasm of skin, unspecified: Secondary | ICD-10-CM

## 2023-08-15 DIAGNOSIS — L578 Other skin changes due to chronic exposure to nonionizing radiation: Secondary | ICD-10-CM

## 2023-08-15 DIAGNOSIS — D1722 Benign lipomatous neoplasm of skin and subcutaneous tissue of left arm: Secondary | ICD-10-CM

## 2023-08-15 DIAGNOSIS — Z85828 Personal history of other malignant neoplasm of skin: Secondary | ICD-10-CM

## 2023-08-15 DIAGNOSIS — L814 Other melanin hyperpigmentation: Secondary | ICD-10-CM

## 2023-08-15 DIAGNOSIS — D2372 Other benign neoplasm of skin of left lower limb, including hip: Secondary | ICD-10-CM

## 2023-08-15 DIAGNOSIS — D229 Melanocytic nevi, unspecified: Secondary | ICD-10-CM

## 2023-08-15 DIAGNOSIS — D1801 Hemangioma of skin and subcutaneous tissue: Secondary | ICD-10-CM

## 2023-08-15 DIAGNOSIS — W908XXA Exposure to other nonionizing radiation, initial encounter: Secondary | ICD-10-CM

## 2023-08-15 NOTE — Patient Instructions (Signed)
Melanoma ABCDEs  Melanoma is the most dangerous type of skin cancer, and is the leading cause of death from skin disease.  You are more likely to develop melanoma if you: Have light-colored skin, light-colored eyes, or red or blond hair Spend a lot of time in the sun Tan regularly, either outdoors or in a tanning bed Have had blistering sunburns, especially during childhood Have a close family member who has had a melanoma Have atypical moles or large birthmarks  Early detection of melanoma is key since treatment is typically straightforward and cure rates are extremely high if we catch it early.   The first sign of melanoma is often a change in a mole or a new dark spot.  The ABCDE system is a way of remembering the signs of melanoma.  A for asymmetry:  The two halves do not match. B for border:  The edges of the growth are irregular. C for color:  A mixture of colors are present instead of an even brown color. D for diameter:  Melanomas are usually (but not always) greater than 6mm - the size of a pencil eraser. E for evolution:  The spot keeps changing in size, shape, and color.  Please check your skin once per month between visits. You can use a small mirror in front and a large mirror behind you to keep an eye on the back side or your body.   If you see any new or changing lesions before your next follow-up, please call to schedule a visit.  Please continue daily skin protection including broad spectrum sunscreen SPF 30+ to sun-exposed areas, reapplying every 2 hours as needed when you're outdoors.   Staying in the shade or wearing long sleeves, sun glasses (UVA+UVB protection) and wide brim hats (4-inch brim around the entire circumference of the hat) are also recommended for sun protection.    Due to recent changes in healthcare laws, you may see results of your pathology and/or laboratory studies on MyChart before the doctors have had a chance to review them. We understand that in  some cases there may be results that are confusing or concerning to you. Please understand that not all results are received at the same time and often the doctors may need to interpret multiple results in order to provide you with the best plan of care or course of treatment. Therefore, we ask that you please give [place] 2 business days to thoroughly review all your results before contacting the office for clarification. Should we see a critical lab result, you will be contacted sooner.   If You Need Anything After Your Visit  If you have any questions or concerns for your doctor, please call our main line at [phone] and press option 4 to reach your doctor's medical assistant. If no one answers, please leave a voicemail as directed and we will return your call as soon as possible. Messages left after 4 pm will be answered the following business day.   You may also send [place] a message via MyChart. We typically respond to MyChart messages within 1-2 business days.  For prescription refills, please ask your pharmacy to contact our office. Our fax number is [phone].  If you have an urgent issue when the clinic is closed that cannot wait until the next business day, you can page your doctor at the number below.    Please note that while we do our best to be available for urgent issues outside of office hours, we  are not available 24/7.   If you have an urgent issue and are unable to reach [place], you may choose to seek medical care at your doctor's office, retail clinic, urgent care center, or emergency room.  If you have a medical emergency, please immediately call 911 or go to the emergency department.  Pager Numbers  - Dr. Gwen Pounds: [phone]  - Dr. Roseanne Reno: [phone]  - Dr. Katrinka Blazing: [phone]   In the event of inclement weather, please call our main line at [phone] for an update on the status of any delays or closures.  Dermatology Medication Tips: Please keep the boxes that  topical medications come in in order to help keep track of the instructions about where and how to use these. Pharmacies typically print the medication instructions only on the boxes and not directly on the medication tubes.   If your medication is too expensive, please contact our office at [phone] option 4 or send [place] a message through MyChart.   We are unable to tell what your co-pay for medications will be in advance as this is different depending on your insurance coverage. However, we may be able to find a substitute medication at lower cost or fill out paperwork to get insurance to cover a needed medication.   If a prior authorization is required to get your medication covered by your insurance company, please allow [place] 1-2 business days to complete this process.  Drug prices often vary depending on where the prescription is filled and some pharmacies may offer cheaper prices.  The website www.goodrx.com contains coupons for medications through different pharmacies. The prices here do not account for what the cost may be with help from insurance (it may be cheaper with your insurance), but the website can give you the price if you did not use any insurance.  - You can print the associated coupon and take it with your prescription to the pharmacy.  - You may also stop by our office during regular business hours and pick up a GoodRx coupon card.  - If you need your prescription sent electronically to a different pharmacy, notify our office through [facility] or by phone at [phone] option 4.     Si Usted Necesita Algo Despus de Su Visita  Tambin puede enviarnos un mensaje a travs de Clinical cytogeneticist. Por lo general respondemos a los mensajes de MyChart en el transcurso de 1 a 2 das hbiles.  Para renovar recetas, por favor pida a su farmacia que se ponga en contacto con nuestra oficina. Alison Oliver de fax es [place] [phone].  Si tiene un asunto urgente cuando la clnica  est cerrada y que no puede esperar hasta el siguiente da hbil, puede llamar/localizar a su doctor(a) al nmero que aparece a continuacin.   Por favor, tenga en cuenta que aunque hacemos todo lo posible para estar disponibles para asuntos urgentes fuera del horario de [place], no estamos disponibles las 24 horas del da, los 7 [address]  [address] de la [place].   Si tiene un problema urgente y no puede comunicarse con nosotros, puede optar por buscar atencin mdica  en el consultorio de su doctor(a), en una clnica privada, en un centro de atencin urgente o en una sala de emergencias.  Si tiene Engineer, drilling, por favor llame inmediatamente al 911 o vaya a la sala de emergencias.  Nmeros de bper  - Dr. Gwen Pounds: [phone]  - Dra. Roseanne Reno: [phone]  - Dr. Katrinka Blazing: [phone]   En caso de inclemencias del  tiempo, por favor llame a Ferne Coe lnea principal al [phone] para una actualizacin sobre el [place] de cualquier retraso o cierre.  Consejos para la medicacin en dermatologa: Por favor, guarde las cajas en las que vienen los medicamentos de uso tpico para ayudarle a seguir las instrucciones sobre dnde y cmo usarlos. Las farmacias generalmente imprimen las instrucciones del medicamento slo en las cajas y no directamente en los tubos del [place].   Si su medicamento es muy caro, por favor, pngase en contacto con Rolm Gala llamando al [phone] y presione la opcin 4 o envenos un mensaje a travs de Clinical cytogeneticist.   No podemos decirle cul ser su copago por los medicamentos por adelantado ya que esto es diferente dependiendo de la cobertura de su seguro. Sin embargo, es posible que podamos encontrar un medicamento sustituto a Audiological scientist un formulario para que el seguro cubra el medicamento que se considera necesario.   Si se requiere una autorizacin previa para que su compaa de seguros [place] su medicamento, por favor permtanos de 1 a 2 das hbiles para  completar [address]  Los precios de los medicamentos varan con frecuencia dependiendo del Environmental consultant de dnde se surte la receta y alguna farmacias pueden ofrecer precios ms baratos.  El sitio web www.goodrx.com tiene cupones para medicamentos de Health and safety inspector. Los precios aqu no tienen en cuenta lo que podra costar con la ayuda del seguro (puede ser ms barato con su seguro), pero el sitio web puede darle el precio si no utiliz Tourist information centre manager.  - Puede imprimir el cupn correspondiente y llevarlo con su receta a la farmacia.  - Tambin puede pasar por nuestra oficina durante el horario de atencin regular y Education officer, museum una tarjeta de cupones de GoodRx.  - Si necesita que su receta se enve electrnicamente a una farmacia diferente, informe a nuestra oficina a travs de MyChart de [place] o por telfono llamando al [phone] y presione la opcin 4.

## 2023-08-15 NOTE — Progress Notes (Signed)
Follow-Up Visit   Subjective  Alison Oliver is a 62 y.o. female who presents for the following: Skin Cancer Screening and Full Body Skin Exam hx of bcc, hx of isks.  Patient reports 6 months ago noticed discoloration of upper eyelids. Denies pain or itchy but would like checked.    The patient presents for Total-Body Skin Exam (TBSE) for skin cancer screening and mole check. The patient has spots, moles and lesions to be evaluated, some may be new or changing and the patient may have concern these could be cancer.    The following portions of the chart were reviewed this encounter and updated as appropriate: medications, allergies, medical history  Review of Systems:  No other skin or systemic complaints except as noted in HPI or Assessment and Plan.  Objective  Well appearing patient in no apparent distress; mood and affect are within normal limits.  A full examination was performed including scalp, head, eyes, ears, nose, lips, neck, chest, axillae, abdomen, back, buttocks, bilateral upper extremities, bilateral lower extremities, hands, feet, fingers, toes, fingernails, and toenails. All findings within normal limits unless otherwise noted below.   Relevant physical exam findings are noted in the Assessment and Plan.    Assessment & Plan   SKIN CANCER SCREENING PERFORMED TODAY.  ACTINIC DAMAGE - Chronic condition, secondary to cumulative UV/sun exposure - diffuse scaly erythematous macules with underlying dyspigmentation - Recommend daily broad spectrum sunscreen SPF 30+ to sun-exposed areas, reapply every 2 hours as needed.  - Staying in the shade or wearing long sleeves, sun glasses (UVA+UVB protection) and wide brim hats (4-inch brim around the entire circumference of the hat) are also recommended for sun protection.  - Call for new or changing lesions.  LENTIGINES, SEBORRHEIC KERATOSES, HEMANGIOMAS - Benign normal skin lesions - Benign-appearing - Call for any  changes  MELANOCYTIC NEVI - Tan-brown and/or pink-flesh-colored symmetric macules and papules - Benign appearing on exam today - Observation - Call clinic for new or changing moles - Recommend daily use of broad spectrum spf 30+ sunscreen to sun-exposed areas.   Dermatofibroma - L med thigh 0.5 cm - Firm pink/brown papulenodule with dimple sign - Benign appearing - Call for any changes   HISTORY OF BASAL CELL CARCINOMA OF THE SKIN Left upper lip infranasal 01/2020 - No evidence of recurrence today - Recommend regular full body skin exams - Recommend daily broad spectrum sunscreen SPF 30+ to sun-exposed areas, reapply every 2 hours as needed.  - Call if any new or changing lesions are noted between office visits  Eyelid Discoloration at upper eyelids  Exam: normal skin  Treatment Plan: Benign-appearing.  Observation.  Call clinic for new or changing lesions.  Recommend daily use of broad spectrum spf 30+ sunscreen to sun-exposed areas.   If bothered by or concerned about see ocular plastic surgeon    Lipoma  Exam: 1.1 cm Subcutaneous rubbery nodule(s) left wrist area   Treatment:  Benign-appearing. Exam most consistent with a lipoma. Discussed that a lipoma is a benign fatty growth that can grow over time and sometimes get irritated. Recommend observation if it is not bothersome or changing. Discussed option of ILK injections or surgical excision to remove it if it is growing, symptomatic, or other changes noted. Please call for new or changing lesions so they can be evaluated.   Return in about 1 year (around 08/14/2024) for TBSE.  IAsher Muir, CMA, am acting as scribe for Armida Sans, MD.   Documentation:  I have reviewed the above documentation for accuracy and completeness, and I agree with the above.  Armida Sans, MD

## 2023-08-16 LAB — LIPID PANEL
Chol/HDL Ratio: 4.4 ratio (ref 0.0–4.4)
Cholesterol, Total: 173 mg/dL (ref 100–199)
HDL: 39 mg/dL — ABNORMAL LOW (ref >39)
LDL Chol Calc (NIH): 108 mg/dL — ABNORMAL HIGH (ref 0–99)
Triglycerides: 148 mg/dL (ref 0–149)
VLDL Cholesterol Cal: 26 mg/dL (ref 5–40)

## 2023-08-20 ENCOUNTER — Encounter: Payer: Self-pay | Admitting: Dermatology

## 2023-09-20 ENCOUNTER — Telehealth (INDEPENDENT_AMBULATORY_CARE_PROVIDER_SITE_OTHER): Payer: BLUE CROSS/BLUE SHIELD | Admitting: Nurse Practitioner

## 2023-09-20 ENCOUNTER — Encounter: Payer: Self-pay | Admitting: Nurse Practitioner

## 2023-09-20 VITALS — Resp 16 | Ht 59.0 in | Wt 129.0 lb

## 2023-09-20 DIAGNOSIS — J0110 Acute frontal sinusitis, unspecified: Secondary | ICD-10-CM | POA: Diagnosis not present

## 2023-09-20 MED ORDER — DOXYCYCLINE HYCLATE 100 MG PO TABS
100.0000 mg | ORAL_TABLET | Freq: Two times a day (BID) | ORAL | 0 refills | Status: AC
Start: 2023-09-20 — End: 2023-09-30

## 2023-09-20 NOTE — Progress Notes (Signed)
[facility] [address] [place], [place] [address]  Internal MEDICINE  Telephone Visit  Patient Name: Alison Oliver  [id number]  [id number]  Date of Service: 09/20/2023  I connected with the patient at 1015 by telephone and verified the patients identity using two identifiers.   I discussed the limitations, risks, security and privacy concerns of performing an evaluation and management service by telephone and the availability of in person appointments. I also discussed with the patient that there may be a patient responsible charge related to the service.  The patient expressed understanding and agrees to proceed.    Chief Complaint  Patient presents with   Telephone Screen    Been congested, coughing, Covid test was negative.    Telephone Assessment    HPI Alison Oliver presents for a telehealth virtual visit for upper respiratory infection Onset was on Sunday this week, about 5 days Reports nasal congestion, cough Tried advil cold and sinus, no relief Covid test was negative   Current Medication: Outpatient Encounter Medications as of 09/20/2023  Medication Sig   ALPRAZolam (XANAX) 0.5 MG tablet Take 1 tablet (0.5 mg total) by mouth at bedtime as needed for anxiety.   desoximetasone (TOPICORT) 0.25 % cream APPLY  CREAM EXTERNALLY TO AFFECTED AREA TWICE DAILY   [EXPIRED] doxycycline (VIBRA-TABS) 100 MG tablet Take 1 tablet (100 mg total) by mouth 2 (two) times daily for 10 days. Take with food.   fluticasone (FLONASE) 50 MCG/ACT nasal spray Place 2 sprays into both nostrils daily.   hyoscyamine (ANASPAZ) 0.125 MG TBDP disintergrating tablet Place 1 tablet (0.125 mg total) under the tongue every 6 (six) hours as needed for cramping.   simvastatin (ZOCOR) 20 MG tablet Take 1 tablet (20 mg total) by mouth at bedtime.   triamcinolone (KENALOG) 0.025 % cream Apply 1 application topically 2 (two) times daily.   No facility-administered encounter medications on file as of  09/20/2023.    Surgical History: Past Surgical History:  Procedure Laterality Date   CATARACT EXTRACTION, BILATERAL Bilateral 09/21,10/21   CESAREAN SECTION      Medical History: Past Medical History:  Diagnosis Date   Basal cell carcinoma 02/13/2010   Left mid ant. thigh. Superficial.    Hx of basal cell carcinoma 02/02/2020   Left upper lip infranasal. Nodular pattern   Hyperlipidemia    Osteoporosis     Family History: Family History  Problem Relation Age of Onset   Breast cancer Maternal Aunt 55   Breast cancer Maternal Grandmother    Bladder Cancer Father    Prostate cancer Brother     Social History   Socioeconomic History   Marital status: Married    Spouse name: Not on file   Number of children: Not on file   Years of education: Not on file   Highest education level: Not on file  Occupational History   Not on file  Tobacco Use   Smoking status: Never   Smokeless tobacco: Never  Substance and Sexual Activity   Alcohol use: Yes    Comment: social    Drug use: Never   Sexual activity: Not on file  Other Topics Concern   Not on file  Social History Narrative   Not on file   Social Drivers of Health   Financial Resource Strain: Not on file  Food Insecurity: Not on file  Transportation Needs: Not on file  Physical Activity: Not on file  Stress: Not on file  Social Connections: Not on file  Intimate Partner Violence: Not on file      Review of Systems  Constitutional:  Positive for chills and fever. Negative for fatigue.  HENT:  Positive for congestion, postnasal drip, rhinorrhea, sinus pressure and sinus pain. Negative for ear pain and sore throat.   Respiratory:  Positive for cough. Negative for chest tightness, shortness of breath and wheezing.   Cardiovascular: Negative.  Negative for chest pain and palpitations.  Gastrointestinal: Negative.  Negative for diarrhea, nausea and vomiting.  Neurological:  Negative for headaches.    Vital  Signs: Resp 16   Ht 4\' 11"  (1.499 m)   Wt 129 lb (58.5 kg)   BMI 26.05 kg/m    Observation/Objective: She is alert and oriented. No acute distress noted.     Assessment/Plan: 1. Acute non-recurrent frontal sinusitis (Primary) Doxycycline prescribed, take until gone.  - doxycycline (VIBRA-TABS) 100 MG tablet; Take 1 tablet (100 mg total) by mouth 2 (two) times daily for 10 days. Take with food.  Dispense: 20 tablet; Refill: 0   General Counseling: Alison Oliver verbalizes understanding of the findings of today's phone visit and agrees with plan of treatment. I have discussed any further diagnostic evaluation that may be needed or ordered today. We also reviewed her medications today. she has been encouraged to call the office with any questions or concerns that should arise related to todays visit.  Return if symptoms worsen or fail to improve.   No orders of the defined types were placed in this encounter.   Meds ordered this encounter  Medications   doxycycline (VIBRA-TABS) 100 MG tablet    Sig: Take 1 tablet (100 mg total) by mouth 2 (two) times daily for 10 days. Take with food.    Dispense:  20 tablet    Refill:  0    Time spent:10 Minutes Time spent with patient included reviewing progress notes, labs, imaging studies, and discussing plan for follow up.  [place] Controlled Substance Database was reviewed by me for overdose risk score (ORS) if appropriate.  This patient was seen by Sallyanne Kuster, FNP-C in collaboration with Dr. Beverely Risen as a part of collaborative care agreement.  Monice Lundy R. Tedd Sias, MSN, FNP-C Internal medicine

## 2023-11-11 ENCOUNTER — Encounter: Payer: Self-pay | Admitting: Nurse Practitioner

## 2023-12-09 ENCOUNTER — Other Ambulatory Visit: Payer: Self-pay | Admitting: Nurse Practitioner

## 2023-12-09 DIAGNOSIS — Z1231 Encounter for screening mammogram for malignant neoplasm of breast: Secondary | ICD-10-CM

## 2024-02-10 ENCOUNTER — Ambulatory Visit
Admission: RE | Admit: 2024-02-10 | Discharge: 2024-02-10 | Disposition: A | Discharge status: Home / Self Care | Source: Ambulatory Visit | Attending: Nurse Practitioner | Admitting: Nurse Practitioner

## 2024-02-10 DIAGNOSIS — Z1231 Encounter for screening mammogram for malignant neoplasm of breast: Secondary | ICD-10-CM | POA: Insufficient documentation

## 2024-04-08 ENCOUNTER — Other Ambulatory Visit: Payer: Self-pay | Admitting: Nurse Practitioner

## 2024-04-08 DIAGNOSIS — E782 Mixed hyperlipidemia: Secondary | ICD-10-CM

## 2024-04-28 ENCOUNTER — Ambulatory Visit (INDEPENDENT_AMBULATORY_CARE_PROVIDER_SITE_OTHER): Payer: BLUE CROSS/BLUE SHIELD | Admitting: Nurse Practitioner

## 2024-04-28 ENCOUNTER — Encounter: Payer: Self-pay | Admitting: Nurse Practitioner

## 2024-04-28 VITALS — BP 114/80 | HR 73 | Temp 98.2°F | Resp 16 | Ht 59.0 in | Wt 120.6 lb

## 2024-04-28 DIAGNOSIS — E538 Deficiency of other specified B group vitamins: Secondary | ICD-10-CM | POA: Diagnosis not present

## 2024-04-28 DIAGNOSIS — J0110 Acute frontal sinusitis, unspecified: Secondary | ICD-10-CM

## 2024-04-28 DIAGNOSIS — R110 Nausea: Secondary | ICD-10-CM

## 2024-04-28 DIAGNOSIS — Z0001 Encounter for general adult medical examination with abnormal findings: Secondary | ICD-10-CM

## 2024-04-28 DIAGNOSIS — E782 Mixed hyperlipidemia: Secondary | ICD-10-CM | POA: Diagnosis not present

## 2024-04-28 DIAGNOSIS — E559 Vitamin D deficiency, unspecified: Secondary | ICD-10-CM

## 2024-04-28 MED ORDER — SIMVASTATIN 20 MG PO TABS: 20.0000 mg | ORAL_TABLET | Freq: Every day | ORAL | 3 refills | Status: AC

## 2024-04-28 MED ORDER — DOXYCYCLINE HYCLATE 100 MG PO CAPS: 100.0000 mg | ORAL_CAPSULE | Freq: Two times a day (BID) | ORAL | 0 refills | Status: AC

## 2024-04-28 NOTE — Progress Notes (Signed)
 [facility] [address] [place], [place] [address]  Internal MEDICINE  Office Visit Note  Patient Name: Alison Oliver  [id number]  [id number]  Date of Service: 04/28/2024  Chief Complaint  Patient presents with   Hyperlipidemia   Annual Exam    HPI Haliyah presents for an annual well visit and physical exam.  Well-appearing 63 y.o. female with hyperlipidemia, arthritis, and generalized anxiety disorder  Routine CRC screening: cologuard due in 2026 Routine mammogram: done in march this year  DEXA scan: due next year  Pap smear: due in November this year  Labs: due for routine labs  New or worsening pain: none  Has been taking more than 1000 mg of magnesium daily. --having persistent nausea over the past 2 months, also reports some facial flushing and feeling drowsy/tired.    Current Medication: Outpatient Encounter Medications as of 04/28/2024  Medication Sig   ALPRAZolam  (XANAX ) 0.5 MG tablet Take 1 tablet (0.5 mg total) by mouth at bedtime as needed for anxiety.   desoximetasone  (TOPICORT ) 0.25 % cream APPLY  CREAM EXTERNALLY TO AFFECTED AREA TWICE DAILY   fluticasone  (FLONASE ) 50 MCG/ACT nasal spray Place 2 sprays into both nostrils daily.   hyoscyamine  (ANASPAZ ) 0.125 MG TBDP disintergrating tablet Place 1 tablet (0.125 mg total) under the tongue every 6 (six) hours as needed for cramping.   triamcinolone  (KENALOG ) 0.025 % cream Apply 1 application topically 2 (two) times daily.   [DISCONTINUED] simvastatin  (ZOCOR ) 20 MG tablet TAKE 1 TABLET BY MOUTH AT BEDTIME   doxycycline  (VIBRAMYCIN ) 100 MG capsule Take 1 capsule (100 mg total) by mouth 2 (two) times daily for 10 days.   simvastatin  (ZOCOR ) 20 MG tablet Take 1 tablet (20 mg total) by mouth at bedtime.   No facility-administered encounter medications on file as of 04/28/2024.    Surgical History: Past Surgical History:  Procedure Laterality Date   CATARACT EXTRACTION, BILATERAL Bilateral 09/21,10/21    CESAREAN SECTION      Medical History: Past Medical History:  Diagnosis Date   Basal cell carcinoma 02/13/2010   Left mid ant. thigh. Superficial.    Hx of basal cell carcinoma 02/02/2020   Left upper lip infranasal. Nodular pattern   Hyperlipidemia    Osteoporosis     Family History: Family History  Problem Relation Age of Onset   Breast cancer Maternal Aunt 55   Breast cancer Maternal Grandmother    Bladder Cancer Father    Prostate cancer Brother     Social History   Socioeconomic History   Marital status: Married    Spouse name: Not on file   Number of children: Not on file   Years of education: Not on file   Highest education level: Not on file  Occupational History   Not on file  Tobacco Use   Smoking status: Never   Smokeless tobacco: Never  Substance and Sexual Activity   Alcohol use: Yes    Comment: social    Drug use: Never   Sexual activity: Not on file  Other Topics Concern   Not on file  Social History Narrative   Not on file   Social Drivers of Health   Financial Resource Strain: Not on file  Food Insecurity: Not on file  Transportation Needs: Not on file  Physical Activity: Not on file  Stress: Not on file  Social Connections: Not on file  Intimate Partner Violence: Not on file      Review of Systems  Constitutional:  Positive for fatigue. Negative for activity change, appetite change, chills, fever and unexpected weight change.  HENT:  Positive for ear pain, postnasal drip, rhinorrhea, sinus pressure, sinus pain and sore throat. Negative for congestion and trouble swallowing.   Eyes: Negative.   Respiratory:  Positive for cough. Negative for chest tightness, shortness of breath and wheezing.   Cardiovascular: Negative.  Negative for chest pain and palpitations.  Gastrointestinal:  Positive for abdominal distention, abdominal pain, constipation and nausea. Negative for blood in stool, diarrhea and vomiting.  Endocrine: Negative.    Genitourinary: Negative.  Negative for difficulty urinating, dysuria, frequency, hematuria, urgency, vaginal bleeding, vaginal discharge and vaginal pain.  Musculoskeletal: Negative.  Negative for arthralgias, back pain, joint swelling, myalgias and neck pain.  Skin: Negative.  Negative for rash and wound.  Allergic/Immunologic: Negative.  Negative for immunocompromised state.  Neurological:  Positive for headaches. Negative for dizziness, seizures and numbness.  Hematological: Negative.   Psychiatric/Behavioral: Negative.  Negative for behavioral problems, self-injury and suicidal ideas. The patient is not nervous/anxious.     Vital Signs: BP 114/80   Pulse 73   Temp 98.2 F (36.8 C)   Resp 16   Ht 4' 11 (1.499 m)   Wt 120 lb 9.6 oz (54.7 kg)   SpO2 98%   BMI 24.36 kg/m    Physical Exam Vitals reviewed.  Constitutional:      General: She is not in acute distress.    Appearance: Normal appearance. She is normal weight. She is not ill-appearing.  HENT:     Head: Normocephalic and atraumatic.     Right Ear: Tympanic membrane, ear canal and external ear normal.     Left Ear: Tympanic membrane, ear canal and external ear normal.     Nose: Congestion and rhinorrhea present.     Mouth/Throat:     Mouth: Mucous membranes are moist.     Pharynx: Posterior oropharyngeal erythema present.  Eyes:     Pupils: Pupils are equal, round, and reactive to light.  Neck:     Vascular: No carotid bruit.  Cardiovascular:     Rate and Rhythm: Normal rate and regular rhythm.     Heart sounds: Normal heart sounds.  Pulmonary:     Effort: Pulmonary effort is normal. No respiratory distress.     Breath sounds: Normal breath sounds. No wheezing.  Abdominal:     General: Bowel sounds are normal. There is no distension.     Palpations: Abdomen is soft.     Tenderness: There is no abdominal tenderness. There is no guarding.  Musculoskeletal:        General: Normal range of motion.     Cervical  back: Normal range of motion and neck supple.  Lymphadenopathy:     Cervical: No cervical adenopathy.  Skin:    General: Skin is warm and dry.     Capillary Refill: Capillary refill takes less than 2 seconds.  Neurological:     Mental Status: She is alert and oriented to person, place, and time.     Cranial Nerves: No cranial nerve deficit.     Coordination: Coordination normal.     Gait: Gait normal.  Psychiatric:        Mood and Affect: Mood normal.        Behavior: Behavior normal.        Thought Content: Thought content normal.        Judgment: Judgment normal.        Assessment/Plan: 1.  Encounter for routine adult health examination with abnormal findings (Primary) Age-appropriate preventive screenings and vaccinations discussed, annual physical exam completed. Routine labs for health maintenance ordered, see below. PHM updated.   - CBC with Differential/Platelet - CMP14+EGFR - Magnesium - Vitamin D  (25 hydroxy) - B12 and Folate Panel - Iron, TIBC and Ferritin Panel  2. Acute non-recurrent frontal sinusitis Doxycycline  prescribed, take until gone on empty stomach - doxycycline  (VIBRAMYCIN ) 100 MG capsule; Take 1 capsule (100 mg total) by mouth 2 (two) times daily for 10 days.  Dispense: 20 capsule; Refill: 0  3. Nausea without vomiting Check magnesium level, patient has been taking multiple supplements with magnesium in them.  - Magnesium  4. Mixed hyperlipidemia Continue simvastatin  as prescribed. Routine labs ordered  - CBC with Differential/Platelet - CMP14+EGFR - Vitamin D  (25 hydroxy) - simvastatin  (ZOCOR ) 20 MG tablet; Take 1 tablet (20 mg total) by mouth at bedtime.  Dispense: 90 tablet; Refill: 3  5. B12 deficiency Routine labs ordered  - CBC with Differential/Platelet - B12 and Folate Panel - Iron, TIBC and Ferritin Panel  6. Vitamin D  deficiency Routine lab ordered - Vitamin D  (25 hydroxy)      General Counseling: Jaclene verbalizes  understanding of the findings of todays visit and agrees with plan of treatment. I have discussed any further diagnostic evaluation that may be needed or ordered today. We also reviewed her medications today. she has been encouraged to call the office with any questions or concerns that should arise related to todays visit.    Orders Placed This Encounter  Procedures   CBC with Differential/Platelet   CMP14+EGFR   Magnesium   Vitamin D  (25 hydroxy)   B12 and Folate Panel   Iron, TIBC and Ferritin Panel    Meds ordered this encounter  Medications   simvastatin  (ZOCOR ) 20 MG tablet    Sig: Take 1 tablet (20 mg total) by mouth at bedtime.    Dispense:  90 tablet    Refill:  3   doxycycline  (VIBRAMYCIN ) 100 MG capsule    Sig: Take 1 capsule (100 mg total) by mouth 2 (two) times daily for 10 days.    Dispense:  20 capsule    Refill:  0    Fill new script today    Return in about 4 weeks (around 05/26/2024) for F/U, Labs, Sanjeev Main PCP.   Total time spent:30 Minutes Time spent includes review of chart, medications, test results, and follow up plan with the patient.   [place] Controlled Substance Database was reviewed by me.  This patient was seen by Mardy Maxin, FNP-C in collaboration with Dr. Sigrid Bathe as a part of collaborative care agreement.  Phelan Schadt R. Maxin, MSN, FNP-C Internal medicine

## 2024-05-28 LAB — CMP14+EGFR
ALT: 16 IU/L (ref 0–32)
AST: 22 IU/L (ref 0–40)
Albumin: 4.2 g/dL (ref 3.9–4.9)
Alkaline Phosphatase: 99 IU/L (ref 44–121)
BUN/Creatinine Ratio: 32 — ABNORMAL HIGH (ref 12–28)
BUN: 21 mg/dL (ref 8–27)
Bilirubin Total: 0.5 mg/dL (ref 0.0–1.2)
CO2: 22 mmol/L (ref 20–29)
Calcium: 9.4 mg/dL (ref 8.7–10.3)
Chloride: 102 mmol/L (ref 96–106)
Creatinine, Ser: 0.65 mg/dL (ref 0.57–1.00)
Globulin, Total: 2.4 g/dL (ref 1.5–4.5)
Glucose: 88 mg/dL (ref 70–99)
Potassium: 4.5 mmol/L (ref 3.5–5.2)
Sodium: 140 mmol/L (ref 134–144)
Total Protein: 6.6 g/dL (ref 6.0–8.5)
eGFR: 98 mL/min/{1.73_m2} (ref >59)

## 2024-05-28 LAB — IRON,TIBC AND FERRITIN PANEL
Ferritin: 60 ng/mL (ref 15–150)
Iron Saturation: 24 % (ref 15–55)
Iron: 76 ug/dL (ref 27–139)
Total Iron Binding Capacity: 320 ug/dL (ref 250–450)
UIBC: 244 ug/dL (ref 118–369)

## 2024-05-28 LAB — CBC WITH DIFFERENTIAL/PLATELET
Basophils Absolute: 0.1 10*3/uL (ref 0.0–0.2)
Basos: 1 %
EOS (ABSOLUTE): 0.1 10*3/uL (ref 0.0–0.4)
Eos: 2 %
Hematocrit: 40 % (ref 34.0–46.6)
Hemoglobin: 13.2 g/dL (ref 11.1–15.9)
Immature Grans (Abs): 0 10*3/uL (ref 0.0–0.1)
Immature Granulocytes: 0 %
Lymphocytes Absolute: 2.2 10*3/uL (ref 0.7–3.1)
Lymphs: 36 %
MCH: 30.2 pg (ref 26.6–33.0)
MCHC: 33 g/dL (ref 31.5–35.7)
MCV: 92 fL (ref 79–97)
Monocytes Absolute: 0.5 10*3/uL (ref 0.1–0.9)
Monocytes: 9 %
Neutrophils Absolute: 3.2 10*3/uL (ref 1.4–7.0)
Neutrophils: 52 %
Platelets: 268 10*3/uL (ref 150–450)
RBC: 4.37 x10E6/uL (ref 3.77–5.28)
RDW: 12.3 % (ref 11.7–15.4)
WBC: 6.2 10*3/uL (ref 3.4–10.8)

## 2024-05-28 LAB — VITAMIN D 25 HYDROXY (VIT D DEFICIENCY, FRACTURES): Vit D, 25-Hydroxy: 42.8 ng/mL (ref 30.0–100.0)

## 2024-05-28 LAB — MAGNESIUM: Magnesium: 2.2 mg/dL (ref 1.6–2.3)

## 2024-05-28 LAB — B12 AND FOLATE PANEL
Folate: 14 ng/mL (ref >3.0)
Vitamin B-12: 984 pg/mL (ref 232–1245)

## 2024-05-30 ENCOUNTER — Encounter: Payer: Self-pay | Admitting: Nurse Practitioner

## 2024-06-02 ENCOUNTER — Encounter: Payer: Self-pay | Admitting: Nurse Practitioner

## 2024-06-02 ENCOUNTER — Ambulatory Visit: Admitting: Nurse Practitioner

## 2024-06-02 VITALS — BP 118/70 | HR 72 | Temp 98.0°F | Resp 16 | Ht 59.0 in | Wt 120.6 lb

## 2024-06-02 DIAGNOSIS — Z6824 Body mass index (BMI) 24.0-24.9, adult: Secondary | ICD-10-CM

## 2024-06-02 DIAGNOSIS — E782 Mixed hyperlipidemia: Secondary | ICD-10-CM

## 2024-06-02 DIAGNOSIS — R110 Nausea: Secondary | ICD-10-CM | POA: Diagnosis not present

## 2024-06-02 NOTE — Progress Notes (Signed)
 [facility] [address] [place], [place] [address]  Internal MEDICINE  Office Visit Note  Patient Name: Alison Oliver  [id number]  [id number]  Date of Service: 06/02/2024  Chief Complaint  Patient presents with   Hyperlipidemia   Follow-up    Review labs    HPI Christinamarie presents for a follow-up visit for high cholesterol, lab results and weight loss.  High cholesterol -- taking simvastatin . Lab in September was significantly improved and almost normal.  Wants to lose about 10 lbs, has lost about 9 lbs since  Cologuard due in April next year.  Nausea is improving, decreased how much magnesium she is taking daily.   Current Medication: Outpatient Encounter Medications as of 06/02/2024  Medication Sig   ALPRAZolam  (XANAX ) 0.5 MG tablet Take 1 tablet (0.5 mg total) by mouth at bedtime as needed for anxiety.   desoximetasone  (TOPICORT ) 0.25 % cream APPLY  CREAM EXTERNALLY TO AFFECTED AREA TWICE DAILY   fluticasone  (FLONASE ) 50 MCG/ACT nasal spray Place 2 sprays into both nostrils daily.   hyoscyamine  (ANASPAZ ) 0.125 MG TBDP disintergrating tablet Place 1 tablet (0.125 mg total) under the tongue every 6 (six) hours as needed for cramping.   simvastatin  (ZOCOR ) 20 MG tablet Take 1 tablet (20 mg total) by mouth at bedtime.   triamcinolone  (KENALOG ) 0.025 % cream Apply 1 application topically 2 (two) times daily.   No facility-administered encounter medications on file as of 06/02/2024.    Surgical History: Past Surgical History:  Procedure Laterality Date   CATARACT EXTRACTION, BILATERAL Bilateral 09/21,10/21   CESAREAN SECTION      Medical History: Past Medical History:  Diagnosis Date   Basal cell carcinoma 02/13/2010   Left mid ant. thigh. Superficial.    Hx of basal cell carcinoma 02/02/2020   Left upper lip infranasal. Nodular pattern   Hyperlipidemia    Osteoporosis     Family History: Family History  Problem Relation Age of Onset   Breast cancer  Maternal Aunt 55   Breast cancer Maternal Grandmother    Bladder Cancer Father    Prostate cancer Brother     Social History   Socioeconomic History   Marital status: Married    Spouse name: Not on file   Number of children: Not on file   Years of education: Not on file   Highest education level: Not on file  Occupational History   Not on file  Tobacco Use   Smoking status: Never   Smokeless tobacco: Never  Substance and Sexual Activity   Alcohol use: Yes    Comment: social    Drug use: Never   Sexual activity: Not on file  Other Topics Concern   Not on file  Social History Narrative   Not on file   Social Drivers of Health   Financial Resource Strain: Not on file  Food Insecurity: Not on file  Transportation Needs: Not on file  Physical Activity: Not on file  Stress: Not on file  Social Connections: Not on file  Intimate Partner Violence: Not on file      Review of Systems  Constitutional: Negative.  Negative for fatigue.  HENT: Negative.    Respiratory: Negative.  Negative for cough, chest tightness, shortness of breath and wheezing.   Cardiovascular: Negative.  Negative for chest pain and palpitations.  Gastrointestinal:  Positive for nausea (mild, improving with decreased magnesium dose).  Genitourinary: Negative.   Musculoskeletal: Negative.     Vital Signs: BP 118/70  Pulse 72   Temp 98 F (36.7 C)   Resp 16   Ht 4' 11 (1.499 m)   Wt 120 lb 9.6 oz (54.7 kg)   SpO2 97%   BMI 24.36 kg/m    Physical Exam Vitals reviewed.  Constitutional:      General: She is not in acute distress.    Appearance: Normal appearance. She is normal weight. She is not ill-appearing.  HENT:     Head: Normocephalic and atraumatic.  Eyes:     Pupils: Pupils are equal, round, and reactive to light.  Cardiovascular:     Rate and Rhythm: Normal rate and regular rhythm.  Pulmonary:     Effort: Pulmonary effort is normal. No respiratory distress.  Neurological:      Mental Status: She is alert and oriented to person, place, and time.  Psychiatric:        Mood and Affect: Mood normal.        Behavior: Behavior normal.        Assessment/Plan: 1. Nausea without vomiting (Primary) Improving, continue with decreased magnesium dose.   2. Mixed hyperlipidemia Improving, continue statin therapy as prescribed. Repeat lipid panel in 6 months   3. BMI 24.0-24.9, adult Normal BMI, lost 9 lbs since October last year, wants to lose 10 more lbs.    General Counseling: Lilit verbalizes understanding of the findings of todays visit and agrees with plan of treatment. I have discussed any further diagnostic evaluation that may be needed or ordered today. We also reviewed her medications today. she has been encouraged to call the office with any questions or concerns that should arise related to todays visit.    No orders of the defined types were placed in this encounter.   No orders of the defined types were placed in this encounter.   Return in about 6 months (around 12/03/2024) for F/U, Maurion Walkowiak PCP. Order cologuard in January -- due in April next year.    Total time spent:30 Minutes Time spent includes review of chart, medications, test results, and follow up plan with the patient.   [place] Controlled Substance Database was reviewed by me.  This patient was seen by Mardy Maxin, FNP-C in collaboration with Dr. Sigrid Bathe as a part of collaborative care agreement.   Jah Alarid R. Maxin, MSN, FNP-C Internal medicine

## 2024-07-26 ENCOUNTER — Emergency Department

## 2024-07-26 ENCOUNTER — Emergency Department
Admission: EM | Admit: 2024-07-26 | Discharge: 2024-07-26 | Disposition: A | Discharge status: Home / Self Care | Attending: Emergency Medicine | Admitting: Emergency Medicine

## 2024-07-26 ENCOUNTER — Encounter: Payer: Self-pay | Admitting: Emergency Medicine

## 2024-07-26 ENCOUNTER — Other Ambulatory Visit: Payer: Self-pay

## 2024-07-26 DIAGNOSIS — N202 Calculus of kidney with calculus of ureter: Secondary | ICD-10-CM | POA: Diagnosis not present

## 2024-07-26 DIAGNOSIS — R109 Unspecified abdominal pain: Secondary | ICD-10-CM

## 2024-07-26 DIAGNOSIS — N200 Calculus of kidney: Secondary | ICD-10-CM

## 2024-07-26 DIAGNOSIS — R112 Nausea with vomiting, unspecified: Secondary | ICD-10-CM

## 2024-07-26 DIAGNOSIS — D72829 Elevated white blood cell count, unspecified: Secondary | ICD-10-CM | POA: Insufficient documentation

## 2024-07-26 DIAGNOSIS — R197 Diarrhea, unspecified: Secondary | ICD-10-CM | POA: Diagnosis not present

## 2024-07-26 LAB — RESP PANEL BY RT-PCR (RSV, FLU A&B, COVID)  RVPGX2
Influenza A by PCR: NEGATIVE
Influenza B by PCR: NEGATIVE
Resp Syncytial Virus by PCR: NEGATIVE
SARS Coronavirus 2 by RT PCR: NEGATIVE

## 2024-07-26 LAB — CBC
HCT: 40.2 % (ref 36.0–46.0)
Hemoglobin: 13.6 g/dL (ref 12.0–15.0)
MCH: 30 pg (ref 26.0–34.0)
MCHC: 33.8 g/dL (ref 30.0–36.0)
MCV: 88.5 fL (ref 80.0–100.0)
Platelets: 243 K/uL (ref 150–400)
RBC: 4.54 MIL/uL (ref 3.87–5.11)
RDW: 12.8 % (ref 11.5–15.5)
WBC: 11.2 K/uL — ABNORMAL HIGH (ref 4.0–10.5)
nRBC: 0 % (ref 0.0–0.2)

## 2024-07-26 LAB — COMPREHENSIVE METABOLIC PANEL WITH GFR
ALT: 21 U/L (ref 0–44)
AST: 28 U/L (ref 15–41)
Albumin: 4.1 g/dL (ref 3.5–5.0)
Alkaline Phosphatase: 81 U/L (ref 38–126)
Anion gap: 9 (ref 5–15)
BUN: 24 mg/dL — ABNORMAL HIGH (ref 8–23)
CO2: 25 mmol/L (ref 22–32)
Calcium: 9.3 mg/dL (ref 8.9–10.3)
Chloride: 106 mmol/L (ref 98–111)
Creatinine, Ser: 0.61 mg/dL (ref 0.44–1.00)
GFR, Estimated: >60 mL/min (ref >60)
Glucose, Bld: 139 mg/dL — ABNORMAL HIGH (ref 70–99)
Potassium: 3.8 mmol/L (ref 3.5–5.1)
Sodium: 140 mmol/L (ref 135–145)
Total Bilirubin: 1.1 mg/dL (ref 0.0–1.2)
Total Protein: 7.3 g/dL (ref 6.5–8.1)

## 2024-07-26 LAB — URINALYSIS, ROUTINE W REFLEX MICROSCOPIC
Bacteria, UA: NONE SEEN
Bilirubin Urine: NEGATIVE
Glucose, UA: NEGATIVE mg/dL
Ketones, ur: NEGATIVE mg/dL
Leukocytes,Ua: NEGATIVE
Nitrite: NEGATIVE
Protein, ur: 30 mg/dL — AB
RBC / HPF: >50 RBC/hpf (ref 0–5)
Specific Gravity, Urine: 1.025 (ref 1.005–1.030)
Squamous Epithelial / HPF: 0 /HPF (ref 0–5)
pH: 5 (ref 5.0–8.0)

## 2024-07-26 LAB — LIPASE, BLOOD: Lipase: 36 U/L (ref 11–51)

## 2024-07-26 MED ORDER — ONDANSETRON 4 MG PO TBDP: 4.0000 mg | ORAL_TABLET | Freq: Three times a day (TID) | ORAL | 0 refills | Status: DC | PRN

## 2024-07-26 MED ORDER — OXYCODONE HCL 5 MG PO CAPS: 5.0000 mg | ORAL_CAPSULE | Freq: Three times a day (TID) | ORAL | 0 refills | Status: DC | PRN

## 2024-07-26 MED ORDER — TAMSULOSIN HCL 0.4 MG PO CAPS: 0.4000 mg | ORAL_CAPSULE | Freq: Every day | ORAL | 0 refills | Status: AC

## 2024-07-26 MED ORDER — IOHEXOL 300 MG/ML  SOLN
100.0000 mL | Freq: Once | INTRAMUSCULAR | Status: AC | PRN
  Administered 2024-07-26: 100 mL via INTRAVENOUS

## 2024-07-26 MED ORDER — ACETAMINOPHEN 500 MG PO TABS
500.0000 mg | ORAL_TABLET | Freq: Once | ORAL | Status: AC
  Administered 2024-07-26: 500 mg via ORAL

## 2024-07-26 MED ORDER — ACETAMINOPHEN 500 MG PO TABS
1000.0000 mg | ORAL_TABLET | Freq: Once | ORAL | Status: DC
  Filled 2024-07-26: qty 2

## 2024-07-26 NOTE — ED Provider Notes (Signed)
 [name] Provider Note    Event Date/Time   First MD Initiated Contact with Patient 07/26/24 [phone]     (approximate)   History   Abdominal Pain   HPI  Alison Oliver is a 63 y.o. female with history of hyperlipidemia, senting with left-sided abdominal pain.  Has some nausea vomiting today as well as diarrhea.  States that she is typically constipated, takes magnesium daily.  Had noted intermittent bloody mucosa in her bowel movements but states that her most recent bowel movement had no blood in it, is not melanotic.  Denies any fever, no urinary symptoms.  No flank or back pain.  No recent trauma or falls.  States she has history of tortuous colon, had an attempted colonoscopy many years ago but they were unable to pass the camera due to how torturous the colon is.  On independent review, she was seen by pulm in July, has history of hyperlipidemia, does have history of nausea without vomiting, her magnesium dose was decreased,     Physical Exam   Triage Vital Signs: ED Triage Vitals  Encounter Vitals Group     BP 07/26/24 0900 135/75     Girls Systolic BP Percentile --      Girls Diastolic BP Percentile --      Boys Systolic BP Percentile --      Boys Diastolic BP Percentile --      Pulse Rate 07/26/24 0900 74     Resp 07/26/24 0900 17     Temp 07/26/24 0900 98.4 F (36.9 C)     Temp Source 07/26/24 0900 Oral     SpO2 07/26/24 0900 98 %     Weight 07/26/24 0859 119 lb 0.8 oz (54 kg)     Height 07/26/24 0859 4' 11 (1.499 m)     Head Circumference --      Peak Flow --      Pain Score 07/26/24 0859 8     Pain Loc --      Pain Education --      Exclude from Growth Chart --     Most recent vital signs: Vitals:   07/26/24 0900  BP: 135/75  Pulse: 74  Resp: 17  Temp: 98.4 F (36.9 C)  SpO2: 98%     General: Awake, no distress.  CV:  Good peripheral perfusion.  Resp:  Normal effort.  Abd:  No distention.  Soft, mildly tender to  the left upper and left lower quadrant Other:  No flank or CVA tenderness, she is nontoxic-appearing   ED Results / Procedures / Treatments   Labs (all labs ordered are listed, but only abnormal results are displayed) Labs Reviewed  COMPREHENSIVE METABOLIC PANEL WITH GFR - Abnormal; Notable for the following components:      Result Value   Glucose, Bld 139 (*)    BUN 24 (*)    All other components within normal limits  CBC - Abnormal; Notable for the following components:   WBC 11.2 (*)    All other components within normal limits  URINALYSIS, ROUTINE W REFLEX MICROSCOPIC - Abnormal; Notable for the following components:   Color, Urine YELLOW (*)    APPearance CLEAR (*)    Hgb urine dipstick SMALL (*)    Protein, ur 30 (*)    All other components within normal limits  RESP PANEL BY RT-PCR (RSV, FLU A&B, COVID)  RVPGX2  LIPASE, BLOOD      RADIOLOGY On  my independent interpretation, CT without obvious SBO   PROCEDURES:  Critical Care performed: No  Procedures   MEDICATIONS ORDERED IN ED: Medications  iohexol  (OMNIPAQUE ) 300 MG/ML solution 100 mL (100 mLs Intravenous Contrast Given 07/26/24 0959)  acetaminophen  (TYLENOL ) tablet 500 mg (500 mg Oral Given 07/26/24 1053)     IMPRESSION / MDM / ASSESSMENT AND PLAN / ED COURSE  I reviewed the triage vital signs and the nursing notes.                              Differential diagnosis includes, but is not limited to, colitis, diverticulitis, constipation, viral illness, gastroenteritis, also considered GERD, gastritis, pancreatitis, for her intermittent bloody mucosa in her stool, did considered malignancy, mass, diverticulosis.  She denies any blood in her stool today.  Will get labs, make sure she is not anemic, CT, UA.  Patient's presentation is most consistent with acute presentation with potential threat to life or bodily function.  Independent interpretation of labs and imaging below.  Clinical course as below.   Discussed with patient about imaging lab results including incidental findings, she has an appointment scheduled with GI in September.  This feeling a lot better since she has been here.  Will discharge her with prescriptions for Flomax , short prescription for Zofran  and short prescription for oxycodone  for severe breakthrough pain.  Will also give her a number to call for urology to schedule follow-up.  Otherwise considered but no indication for inpatient admission at this time, she safe for outpatient management.  Will discharge with strict return precautions.  Shared decision making to patient and she is agreeable with this plan.   Clinical Course as of 07/26/24 1102  Sun Jul 26, 2024  9045 Independent review of labs, mild leukocytosis, UA is not consistent with UTI, electrolytes not severely deranged, LFTs are normal.  Her H&H is stable.  Respiratory viral panel is negative. [TT]  1039 CT ABDOMEN PELVIS W CONTRAST IMPRESSION: 1. 1 mm calculus in the proximal left ureter with mild distention of the left renal collecting system and mild regional inflammatory changes in the retroperitoneum. 2. Refluxing mildly dilated left gonadal vein, correlate with any clinical evidence of pelvic congestion syndrome.   [TT]  1057 On reassessment the pain is improving, not nauseous, tolerating p.o.  Discussed with her about imaging and lab results including findings of kidney stone and microscopic hematuria.  Will prescribe her some Flomax , short prescription for Zofran  as needed and oxycodone  for severe breakthrough pain.  Will give her number to call for urology.  Patient already has a GI follow-up scheduled in September. [TT]    Clinical Course User Index [TT] Waymond Lorelle Cummins, MD     FINAL CLINICAL IMPRESSION(S) / ED DIAGNOSES   Final diagnoses:  Abdominal pain, unspecified abdominal location  Kidney stone  Nausea vomiting and diarrhea     Rx / DC Orders   ED Discharge Orders          Ordered     ondansetron  (ZOFRAN -ODT) 4 MG disintegrating tablet  Every 8 hours PRN        07/26/24 1100    oxycodone  (OXY-IR) 5 MG capsule  Every 8 hours PRN        07/26/24 1100    tamsulosin  (FLOMAX ) 0.4 MG CAPS capsule  Daily after supper        07/26/24 1100  Note:  This document was prepared using Dragon voice recognition software and may include unintentional dictation errors.    Waymond Lorelle Cummins, MD 07/26/24 [phone]

## 2024-07-26 NOTE — ED Triage Notes (Signed)
 Patient to ED via POV for left sided abd pain with vomiting. Started at [facility] today. NAD noted. Last BM yesterday.

## 2024-07-26 NOTE — ED Notes (Signed)
Blood work sent

## 2024-07-26 NOTE — Discharge Instructions (Addendum)
 Please make sure to see your gastroenterologist for further management of your intermittent bloody mucus in your stool.  Please make sure to follow-up with urology for further management of your kidney stone.  Please reserve the oxycodone  for severe breakthrough pain, please not drive or operate heavy machinery if you are on the oxycodone  since it can make you sleepy.  Please take Tylenol  500 mg every 6 hours as needed for pain.  Also take the Flomax  as prescribed.

## 2024-08-13 ENCOUNTER — Ambulatory Visit: Admitting: Dermatology

## 2024-08-20 ENCOUNTER — Ambulatory Visit: Payer: BLUE CROSS/BLUE SHIELD | Admitting: Dermatology

## 2024-09-02 ENCOUNTER — Ambulatory Visit

## 2024-09-24 ENCOUNTER — Ambulatory Visit

## 2024-09-24 DIAGNOSIS — L821 Other seborrheic keratosis: Secondary | ICD-10-CM

## 2024-09-24 DIAGNOSIS — R21 Rash and other nonspecific skin eruption: Secondary | ICD-10-CM

## 2024-09-24 DIAGNOSIS — L820 Inflamed seborrheic keratosis: Secondary | ICD-10-CM | POA: Diagnosis not present

## 2024-09-24 DIAGNOSIS — W908XXA Exposure to other nonionizing radiation, initial encounter: Secondary | ICD-10-CM

## 2024-09-24 DIAGNOSIS — L814 Other melanin hyperpigmentation: Secondary | ICD-10-CM

## 2024-09-24 DIAGNOSIS — Z1283 Encounter for screening for malignant neoplasm of skin: Secondary | ICD-10-CM | POA: Diagnosis not present

## 2024-09-24 DIAGNOSIS — L578 Other skin changes due to chronic exposure to nonionizing radiation: Secondary | ICD-10-CM

## 2024-09-24 DIAGNOSIS — D1801 Hemangioma of skin and subcutaneous tissue: Secondary | ICD-10-CM

## 2024-09-24 DIAGNOSIS — D492 Neoplasm of unspecified behavior of bone, soft tissue, and skin: Secondary | ICD-10-CM | POA: Diagnosis not present

## 2024-09-24 DIAGNOSIS — D229 Melanocytic nevi, unspecified: Secondary | ICD-10-CM

## 2024-09-24 DIAGNOSIS — Z85828 Personal history of other malignant neoplasm of skin: Secondary | ICD-10-CM

## 2024-09-24 NOTE — Progress Notes (Signed)
 Subjective   Alison Oliver is a 63 y.o. female who presents for the following: Total body skin exam for skin cancer screening and mole check. The patient has spots, moles and lesions to be evaluated, some may be new or changing and the patient may have concern these could be cancer. Patient is established patient .  Today patient reports: LOC at left lower leg x couple weeks. HxBCC.   Review of Systems:    No other skin or systemic complaints except as noted in HPI or Assessment and Plan.  The following portions of the chart were reviewed this encounter and updated as appropriate: medications, allergies, medical history  Relevant Medical History:  Personal history of non melanoma skin cancer - see medical history for full details   Objective  Well appearing patient in no apparent distress; mood and affect are within normal limits. Examination was performed of the: Full Skin Examination: scalp, head, eyes, ears, nose, lips, neck, chest, axillae, abdomen, back, buttocks, bilateral upper extremities, bilateral lower extremities, hands, feet, fingers, toes, fingernails, and toenails.   Examination notable for: Angioma(s): Scattered red vascular papule(s)  , Lentigo/lentigines: Scattered pigmented macules that are tan to brown in color and are somewhat non-uniform in shape and concentrated in the sun-exposed areas, Nevus/nevi: Scattered well-demarcated, regular, pigmented macule(s) and/or papule(s)  , Seborrheic Keratosis(es): Stuck-on appearing keratotic papule(s) on the trunk, some  irritated with redness, crusting, edema, and/or partial avulsion, Actinic Damage/Elastosis: chronic sun damage: dyspigmentation, telangiectasia, and wrinkling  - Dermatofibroma(s): Firm dermal nodule(s) with a hyperpigmented halo and a positive dimple sign located on the legs  - faint yellow/purple patch of eyelids  Examination limited by: Undergarments, Makeup , and Patient deferred removal        Left  lower leg x1 Stuck on waxy paps with erythema Left chest 2 mm pink pearly papule   Assessment & Plan   SKIN CANCER SCREENING PERFORMED TODAY.  BENIGN SKIN FINDINGS  - Lentigines  - Seborrheic keratoses  - Hemangiomas   - Nevus/Multiple Benign Nevi - Dermatofibroma  - Telangiectasias - Reassurance provided regarding the benign appearance of lesions noted on exam today; no treatment is indicated in the absence of symptoms/changes. - Reinforced importance of photoprotective strategies including liberal and frequent sunscreen use of a broad-spectrum SPF 30 or greater, use of protective clothing, and sun avoidance for prevention of cutaneous malignancy and photoaging.  Counseled patient on the importance of regular self-skin monitoring as well as routine clinical skin examinations as scheduled.   ACTINIC DAMAGE - Chronic condition, secondary to cumulative UV/sun exposure - Recommend daily broad spectrum sunscreen SPF 30+ to sun-exposed areas, reapply every 2 hours as needed.  - Staying in the shade or wearing long sleeves, sun glasses (UVA+UVB protection) and wide brim hats (4-inch brim around the entire circumference of the hat) are also recommended for sun protection.  - Call for new or changing lesions.  Personal history of non melanoma skin cancer (basal cell carcinoma) - Reviewed medical history for full details  - Reviewed sun protective measures as above - Encouraged full body skin exams     Neoplasm of uncertain behavior at right cheek - spider angioma vs bcc  - Discussed biopsy, patient defers today plan to recheck in 6 months. Photo taken today.   8 mo hx of yellow discoloration of eyelids  - undiagnosed problem w/ uncertain prognosis  - suspect related to age/microtrauma. Discussed more rare causes including xanthelasma, NXG, pinch purpura/amyloidosis though less likely -  Reviewed labs from 06/2024: CBC w/ mild elevation in WBC, UA w/ blood, protein iso kidney stone, CMP  wnl  - Repeat basic labs - cbc/diff, cmp, lipid panel, SPEP/UPEP   Level of service outlined above   Procedures, orders, diagnosis for this visit:  INFLAMED SEBORRHEIC KERATOSIS Left lower leg x1 Symptomatic, irritating, patient would like treated. Destruction of lesion - Left lower leg x1 Complexity: simple   Destruction method: cryotherapy   Informed consent: discussed and consent obtained   Timeout:  patient name, date of birth, surgical site, and procedure verified Lesion destroyed using liquid nitrogen: Yes   Region frozen until ice ball extended beyond lesion: Yes   Cryo cycles: 1 or 2. Outcome: patient tolerated procedure well with no complications   Post-procedure details: wound care instructions given   Additional details:  Prior to procedure, discussed risks of blister formation, small wound, skin dyspigmentation, or rare scar following cryotherapy. Recommend Vaseline ointment to treated areas while healing.   NEOPLASM OF SKIN Left chest Skin / nail biopsy Type of biopsy: tangential   Informed consent: discussed and consent obtained   Timeout: patient name, date of birth, surgical site, and procedure verified   Procedure prep:  Patient was prepped and draped in usual sterile fashion Prep type:  Isopropyl alcohol Anesthesia: the lesion was anesthetized in a standard fashion   Anesthetic:  1% lidocaine w/ epinephrine 1-100,000 buffered w/ 8.4% NaHCO3 Instrument used: DermaBlade   Hemostasis achieved with: pressure and aluminum chloride   Outcome: patient tolerated procedure well   Post-procedure details: sterile dressing applied and wound care instructions given   Dressing type: bandage and petrolatum    Specimen 1 - Surgical pathology Differential Diagnosis: BCC  Check Margins: No 2 mm pink pearly papule Related Procedures CBC with Differential/Platelet CMP Lipid Panel Protein Electrophoresis, Serum Protein Electrophoresis, Urine Rflx. SKIN CANCER  SCREENING    Neoplasm of skin -     Skin / nail biopsy -     Surgical pathology; Standing -     CBC with Differential/Platelet -     Comprehensive metabolic panel with GFR -     Lipid panel -     Protein electrophoresis, serum -     Protein Electrophoresis, Urine Rflx.  Inflamed seborrheic keratosis -     Destruction of lesion  Skin cancer screening    Return to clinic: Return in about 6 months (around 03/25/2025) for TBSE, HxBCC, w/ Dr. Raymund.  I, Jacquelynn V. Wilfred, CMA, am acting as scribe for Lauraine JAYSON Raymund, MD.  Documentation: I have reviewed the above documentation for accuracy and completeness, and I agree with the above.  Lauraine JAYSON Raymund, MD

## 2024-09-24 NOTE — Patient Instructions (Addendum)
 The bandage should remain in place until tomorrow. Remove the bandages and clean the wound once daily as follows:  - Wash your hands. Clean the wound gently with soap and water, and then pat dry. Do not rub. Apply a small amount of Petroleum Jelly or Vaseline. Cover the area with a Band-Aid.  A small amount of bleeding is normal. If bleeding persists, apply firm pressure over the bandage for 5 to 10 minutes without interruption. If bleeding continues, call our office. Continue to clean the area as directed above until the wound is healed. Shave biopsies may take several weeks to heal. It is normal if the edges are pink/red and the center is slight yellowish or white in color. However if the site becomes hot, swollen, has a thick drainage or redness that expands away from the site please call us . We will contact you with results once available.    Cryotherapy Aftercare  Wash gently with soap and water everyday.   Apply Vaseline and Band-Aid daily until healed.  Sunscreen  Who needs sunscreen? Everyone. Sunscreen use can help prevent skin cancer by protecting you from the sun's harmful ultraviolet rays. Anyone can get skin cancer, regardless of age, gender or race. In fact, it is estimated that one in five Americans will develop skin cancer in their lifetime.  Sunscreen alone cannot fully protect you. In addition to wearing sunscreen, dermatologists recommend taking the following steps to protect your skin and find skin cancer early:  Seek shade when appropriate, remembering that the sun's rays are strongest between 10 a.m. and 2 p.m. If your shadow is shorter than you are, seek shade. Dress to protect yourself from the sun by wearing a lightweight long-sleeved shirt, pants, a wide-brimmed hat and sunglasses, when possible.  Use extra caution near water, snow and sand as they reflect the damaging rays of the sun, which can increase your chance of sunburn.  Get vitamin D  safely through a healthy diet  that may include vitamin supplements. Don't seek the sun. Avoid tanning beds. Ultraviolet light from the sun and tanning beds can cause skin cancer and wrinkling. If you want to look tan, you may wish to use a self-tanning product, but continue to use sunscreen with it.  When should I use sunscreen? Every day you go outside--even if you're just walking to and from your form of transportation. The sun emits harmful UV rays year-round. Even on cloudy days, up to 80 percent of the sun's harmful UV rays can penetrate your skin. Snow, sand and water increase the need for sunscreen because they reflect the sun's rays.  How much sunscreen should I use, and how often should I apply it? Most people only apply 25-50 percent of the recommended amount of sunscreen. Apply enough sunscreen to cover all exposed skin. Most adults need about 1 ounce -- or enough to fill a shot glass -- to fully cover their body.  Don't forget to apply to the tops of your feet, your neck, your ears and the top of your head. Apply sunscreen to dry skin 15 minutes before going outdoors.  Skin cancer also can form on the lips. To protect your lips, apply a lip balm or lipstick that contains sunscreen with an SPF of 30 or higher.  When outdoors, reapply sunscreen approximately every two hours, or after swimming or sweating, according to the directions on the bottle.   Broad-spectrum sunscreens protect against both UVA and UVB rays. What is the difference between the rays? Sunlight  consists of two types of harmful rays that reach the earth -- UVA rays and UVB rays. Overexposure to either can lead to skin cancer. In addition to causing skin cancer, here's what each of these rays do:  UVA rays (or aging rays) can prematurely age your skin, causing wrinkles and age spots, and can pass through window glass. UVB rays (or burning rays) are the primary cause of sunburn and are blocked by window glass  There is no safe way to tan. Every time you  tan, you damage your skin. As this damage builds, you speed up the aging of your skin and increase your risk for all types of skin cancer.  What is the difference between chemical and physical sunscreens? Chemical sunscreens work like a sponge, absorbing the sun's rays. They contain one or more of the following active ingredients: oxybenzone, avobenzone, octisalate, octocrylene, homosalate and octinoxate. These formulations tend to be easier to rub into the skin without leaving a white residue.   Physical sunscreens work like a shield, sitting sit on the surface of your skin and deflecting the sun's rays. They contain the active ingredients zinc oxide and/or titanium dioxide. Use this sunscreen if you have sensitive skin.   What type of sunscreen should I use? The best type of sunscreen is the one you will use again and again. Just make sure it offers broad-spectrum (UVA and UVB) protection, has an SPF of 30+, and is water-resistant. The kind of sunscreen you use is a matter of personal choice, and may vary depending on the area of the body to be protected. Available sunscreen options include lotions, creams, gels, ointments, wax sticks and sprays.  Recommended physical sunscreens for face: - Neutrogena Sheer Zinc - Aveeno Positively Mineral Sensitive - CeraVe Hydrating Mineral (also has a tinted version) - La Roche-Posay Anthelios Mineral Face (comes as a cream, lotion, light fluid, and there is also a tinted version).  - EltaMD UV Clear (also has a tinted version)  Recommended physical sunscreens for body: - Neutrogena Sheer Zinc Dry-Touch Sunscreen Sensitive Skin Lotion Broad Spectrum SPF 50 - Aveeno Positively Mineral Sensitive Skin Sunscreen Broad Spectrum SPF 50 - La Roche-Posay Anthelios SPF 50 Mineral Sunscreen - Gentle Lotion - CeraVe Hydrating Mineral Sunscreen SPF 50  Recommended chemical sunscreens for face: - Anthelios UV Correct Face Sunscreen SPF 70 with Niacinamide - Neutrogena  Clear Face Oil-Free SPF 50 with Helioplex - Neutrogena Sport Face Oil-Free SPF 70+ with Helioplex - Aveeno Protect + Hydrate Sunscreen For Face SPF 70 - La Roche-Posay Anthelios Light Fluid Sunscreen for Face SPF 60  Recommended chemical sunscreens for body: - Neutrogena Ultra Sheer Dry-Touch Sunscreen SPF 70 - Aveeno Protect + Hydrate Broad Spectrum All-Day Hydration SPF 60 (comes in a big pump) - La Roche-Posay Anthelios Melt-In Milk Sunscreen SPF 60  Melanoma ABCDEs  Melanoma is the most dangerous type of skin cancer, and is the leading cause of death from skin disease.  You are more likely to develop melanoma if you: Have light-colored skin, light-colored eyes, or red or blond hair Spend a lot of time in the sun Tan regularly, either outdoors or in a tanning bed Have had blistering sunburns, especially during childhood Have a close family member who has had a melanoma Have atypical moles or large birthmarks  Early detection of melanoma is key since treatment is typically straightforward and cure rates are extremely high if we catch it early.   The first sign of melanoma is often a change  in a mole or a new dark spot.  The ABCDE system is a way of remembering the signs of melanoma.  A for asymmetry:  The two halves do not match. B for border:  The edges of the growth are irregular. C for color:  A mixture of colors are present instead of an even brown color. D for diameter:  Melanomas are usually (but not always) greater than 6mm - the size of a pencil eraser. E for evolution:  The spot keeps changing in size, shape, and color.  Please check your skin once per month between visits. You can use a small mirror in front and a large mirror behind you to keep an eye on the back side or your body.   If you see any new or changing lesions before your next follow-up, please call to schedule a visit.  Please continue daily skin protection including broad spectrum sunscreen SPF 30+ to  sun-exposed areas, reapplying every 2 hours as needed when you're outdoors.   Staying in the shade or wearing long sleeves, sun glasses (UVA+UVB protection) and wide brim hats (4-inch brim around the entire circumference of the hat) are also recommended for sun protection.   Due to recent changes in healthcare laws, you may see results of your pathology and/or laboratory studies on MyChart before the doctors have had a chance to review them. We understand that in some cases there may be results that are confusing or concerning to you. Please understand that not all results are received at the same time and often the doctors may need to interpret multiple results in order to provide you with the best plan of care or course of treatment. Therefore, we ask that you please give us  2 business days to thoroughly review all your results before contacting the office for clarification. Should we see a critical lab result, you will be contacted sooner.   If You Need Anything After Your Visit  If you have any questions or concerns for your doctor, please call our main line at [phone] and press option 4 to reach your doctor's medical assistant. If no one answers, please leave a voicemail as directed and we will return your call as soon as possible. Messages left after 4 pm will be answered the following business day.   You may also send us  a message via MyChart. We typically respond to MyChart messages within 1-2 business days.  For prescription refills, please ask your pharmacy to contact our office. Our fax number is [phone].  If you have an urgent issue when the clinic is closed that cannot wait until the next business day, you can page your doctor at the number below.    Please note that while we do our best to be available for urgent issues outside of office hours, we are not available 24/7.   If you have an urgent issue and are unable to reach us , you may choose to seek medical care at your  doctor's office, retail clinic, urgent care center, or emergency room.  If you have a medical emergency, please immediately call 911 or go to the emergency department.  Pager Numbers  - Dr. Hester: [phone]  - Dr. Jackquline: [phone]  - Dr. Claudene: [phone]   In the event of inclement weather, please call our main line at [phone] for an update on the status of any delays or closures.  Dermatology Medication Tips: Please keep the boxes that topical medications come in in order to help keep  track of the instructions about where and how to use these. Pharmacies typically print the medication instructions only on the boxes and not directly on the medication tubes.   If your medication is too expensive, please contact our office at [phone] option 4 or send us  a message through MyChart.   We are unable to tell what your co-pay for medications will be in advance as this is different depending on your insurance coverage. However, we may be able to find a substitute medication at lower cost or fill out paperwork to get insurance to cover a needed medication.   If a prior authorization is required to get your medication covered by your insurance company, please allow us  1-2 business days to complete this process.  Drug prices often vary depending on where the prescription is filled and some pharmacies may offer cheaper prices.  The website www.goodrx.com contains coupons for medications through different pharmacies. The prices here do not account for what the cost may be with help from insurance (it may be cheaper with your insurance), but the website can give you the price if you did not use any insurance.  - You can print the associated coupon and take it with your prescription to the pharmacy.  - You may also stop by our office during regular business hours and pick up a GoodRx coupon card.  - If you need your prescription sent electronically to a different pharmacy, notify  our office through [facility] or by phone at [phone] option 4.     Si Usted Necesita Algo Despus de Su Visita  Tambin puede enviarnos un mensaje a travs de Clinical Cytogeneticist. Por lo general respondemos a los mensajes de MyChart en el transcurso de 1 a 2 das hbiles.  Para renovar recetas, por favor pida a su farmacia que se ponga en contacto con nuestra oficina. Randi lakes de fax es [place] [phone].  Si tiene un asunto urgente cuando la clnica est cerrada y que no puede esperar hasta el siguiente da hbil, puede llamar/localizar a su doctor(a) al nmero que aparece a continuacin.   Por favor, tenga en cuenta que aunque hacemos todo lo posible para estar disponibles para asuntos urgentes fuera del horario de [place], no estamos disponibles las 24 horas del da, los 7 [address]  [address] de la [place].   Si tiene un problema urgente y no puede comunicarse con nosotros, puede optar por buscar atencin mdica  en el consultorio de su doctor(a), en una clnica privada, en un centro de atencin urgente o en una sala de emergencias.  Si tiene engineer, drilling, por favor llame inmediatamente al 911 o vaya a la sala de emergencias.  Nmeros de bper  - Dr. Hester: [phone]  - Dra. Jackquline: [phone]  - Dr. Claudene: [phone]   En caso de inclemencias del tiempo, por favor llame a landry capes principal al [phone] para una actualizacin sobre el [place] de cualquier retraso o cierre.  Consejos para la medicacin en dermatologa: Por favor, guarde las cajas en las que vienen los medicamentos de uso tpico para ayudarle a seguir las instrucciones sobre dnde y cmo usarlos. Las farmacias generalmente imprimen las instrucciones del medicamento slo en las cajas y no directamente en los tubos del [place].   Si su medicamento es muy caro, por favor, pngase en contacto con landry rieger llamando al [phone] y presione la opcin 4 o envenos un mensaje a travs de  Clinical Cytogeneticist.   No podemos decirle cul ser su copago por los  medicamentos por adelantado ya que esto es diferente dependiendo de la cobertura de su seguro. Sin embargo, es posible que podamos encontrar un medicamento sustituto a audiological scientist un formulario para que el seguro cubra el medicamento que se considera necesario.   Si se requiere una autorizacin previa para que su compaa de seguros cubra su medicamento, por favor permtanos de 1 a 2 das hbiles para completar este proceso.  Los precios de los medicamentos varan con frecuencia dependiendo del environmental consultant de dnde se surte la receta y alguna farmacias pueden ofrecer precios ms baratos.  El sitio web www.goodrx.com tiene cupones para medicamentos de health and safety inspector. Los precios aqu no tienen en cuenta lo que podra costar con la ayuda del seguro (puede ser ms barato con su seguro), pero el sitio web puede darle el precio si no utiliz tourist information centre manager.  - Puede imprimir el cupn correspondiente y llevarlo con su receta a la farmacia.  - Tambin puede pasar por nuestra oficina durante el horario de atencin regular y education officer, museum una tarjeta de cupones de GoodRx.  - Si necesita que su receta se enve electrnicamente a una farmacia diferente, informe a nuestra oficina a travs de MyChart de [place] o por telfono llamando al [phone] y presione la opcin 4.

## 2024-09-29 ENCOUNTER — Ambulatory Visit: Payer: Self-pay

## 2024-09-29 LAB — PROTEIN ELECTROPHORESIS, SERUM
A/G Ratio: 1.2 (ref 0.7–1.7)
Albumin ELP: 3.8 g/dL (ref 2.9–4.4)
Alpha 1: 0.3 g/dL (ref 0.0–0.4)
Alpha 2: 0.8 g/dL (ref 0.4–1.0)
Beta: 1.4 g/dL — ABNORMAL HIGH (ref 0.7–1.3)
Gamma Globulin: 0.8 g/dL (ref 0.4–1.8)
Globulin, Total: 3.3 g/dL (ref 2.2–3.9)

## 2024-09-29 LAB — PROTEIN ELECTROPHORESIS, URINE REFLEX
Albumin ELP, Urine: 100 %
Alpha-1-Globulin, U: 0 %
Alpha-2-Globulin, U: 0 %
Beta Globulin, U: 0 %
Gamma Globulin, U: 0 %
Protein, Ur: 4.7 mg/dL

## 2024-09-29 LAB — CBC WITH DIFFERENTIAL/PLATELET
Basophils Absolute: 0.1 x10E3/uL (ref 0.0–0.2)
Basos: 1 %
EOS (ABSOLUTE): 0.1 x10E3/uL (ref 0.0–0.4)
Eos: 2 %
Hematocrit: 42 % (ref 34.0–46.6)
Hemoglobin: 14.1 g/dL (ref 11.1–15.9)
Immature Grans (Abs): 0 x10E3/uL (ref 0.0–0.1)
Immature Granulocytes: 0 %
Lymphocytes Absolute: 2.7 x10E3/uL (ref 0.7–3.1)
Lymphs: 37 %
MCH: 30.4 pg (ref 26.6–33.0)
MCHC: 33.6 g/dL (ref 31.5–35.7)
MCV: 91 fL (ref 79–97)
Monocytes Absolute: 0.5 x10E3/uL (ref 0.1–0.9)
Monocytes: 7 %
Neutrophils Absolute: 4 x10E3/uL (ref 1.4–7.0)
Neutrophils: 53 %
Platelets: 281 x10E3/uL (ref 150–450)
RBC: 4.64 x10E6/uL (ref 3.77–5.28)
RDW: 12.7 % (ref 11.7–15.4)
WBC: 7.5 x10E3/uL (ref 3.4–10.8)

## 2024-09-29 LAB — COMPREHENSIVE METABOLIC PANEL WITH GFR
ALT: 22 IU/L (ref 0–32)
AST: 27 IU/L (ref 0–40)
Albumin: 4.5 g/dL (ref 3.9–4.9)
Alkaline Phosphatase: 116 IU/L (ref 49–135)
BUN/Creatinine Ratio: 25 (ref 12–28)
BUN: 16 mg/dL (ref 8–27)
Bilirubin Total: 0.7 mg/dL (ref 0.0–1.2)
CO2: 22 mmol/L (ref 20–29)
Calcium: 9.8 mg/dL (ref 8.7–10.3)
Chloride: 102 mmol/L (ref 96–106)
Creatinine, Ser: 0.64 mg/dL (ref 0.57–1.00)
Globulin, Total: 2.6 g/dL (ref 1.5–4.5)
Glucose: 94 mg/dL (ref 70–99)
Potassium: 4.2 mmol/L (ref 3.5–5.2)
Sodium: 142 mmol/L (ref 134–144)
Total Protein: 7.1 g/dL (ref 6.0–8.5)
eGFR: 99 mL/min/1.73 (ref >59)

## 2024-09-29 LAB — LIPID PANEL
Chol/HDL Ratio: 5 ratio — ABNORMAL HIGH (ref 0.0–4.4)
Cholesterol, Total: 176 mg/dL (ref 100–199)
HDL: 35 mg/dL — ABNORMAL LOW (ref >39)
LDL Chol Calc (NIH): 104 mg/dL — ABNORMAL HIGH (ref 0–99)
Triglycerides: 212 mg/dL — ABNORMAL HIGH (ref 0–149)
VLDL Cholesterol Cal: 37 mg/dL (ref 5–40)

## 2024-09-29 NOTE — Progress Notes (Signed)
 Lab added; [facility] is faxing over a confirmation form to be signed.

## 2024-09-30 LAB — SURGICAL PATHOLOGY

## 2024-10-06 ENCOUNTER — Ambulatory Visit: Payer: Self-pay

## 2024-10-06 DIAGNOSIS — K640 First degree hemorrhoids: Secondary | ICD-10-CM | POA: Diagnosis not present

## 2024-10-06 DIAGNOSIS — K625 Hemorrhage of anus and rectum: Secondary | ICD-10-CM | POA: Diagnosis present

## 2024-10-15 NOTE — Telephone Encounter (Signed)
 Form is being re faxed today.  I was able to get a preliminary copy and placed it on your desk.  Thank you!

## 2024-10-16 LAB — IMMUNOFIXATION, SERUM
IgG (Immunoglobin G), Serum: 1007 mg/dL (ref 586–1602)
IgM (Immunoglobulin M), Srm: 73 mg/dL (ref 26–217)
Immunoglobulin A, (IgA) QN, Serum: 253 mg/dL (ref 87–352)

## 2024-10-16 LAB — SPECIMEN STATUS REPORT

## 2024-10-19 NOTE — Progress Notes (Signed)
Patient informed and voiced good understanding.

## 2024-12-01 ENCOUNTER — Ambulatory Visit: Admitting: Nurse Practitioner

## 2024-12-01 ENCOUNTER — Encounter: Payer: Self-pay | Admitting: Nurse Practitioner

## 2024-12-01 VITALS — BP 120/70 | HR 112 | Temp 98.2°F | Resp 16 | Ht 59.0 in | Wt 120.8 lb

## 2024-12-01 DIAGNOSIS — J029 Acute pharyngitis, unspecified: Secondary | ICD-10-CM

## 2024-12-01 DIAGNOSIS — R051 Acute cough: Secondary | ICD-10-CM | POA: Diagnosis not present

## 2024-12-01 DIAGNOSIS — E559 Vitamin D deficiency, unspecified: Secondary | ICD-10-CM | POA: Diagnosis not present

## 2024-12-01 DIAGNOSIS — J069 Acute upper respiratory infection, unspecified: Secondary | ICD-10-CM

## 2024-12-01 DIAGNOSIS — Z1231 Encounter for screening mammogram for malignant neoplasm of breast: Secondary | ICD-10-CM | POA: Diagnosis not present

## 2024-12-01 DIAGNOSIS — E782 Mixed hyperlipidemia: Secondary | ICD-10-CM | POA: Diagnosis not present

## 2024-12-01 DIAGNOSIS — R6889 Other general symptoms and signs: Secondary | ICD-10-CM | POA: Diagnosis not present

## 2024-12-01 LAB — POCT INFLUENZA A/B
Influenza A, POC: NEGATIVE
Influenza B, POC: NEGATIVE

## 2024-12-01 LAB — POCT RAPID STREP A (OFFICE): Rapid Strep A Screen: NEGATIVE

## 2024-12-01 MED ORDER — DOXYCYCLINE HYCLATE 100 MG PO TABS: 100.0000 mg | ORAL_TABLET | Freq: Two times a day (BID) | ORAL | 0 refills | Status: AC

## 2024-12-01 MED ORDER — HYDROCOD POLI-CHLORPHE POLI ER 10-8 MG/5ML PO SUER: 5.0000 mL | Freq: Two times a day (BID) | ORAL | 0 refills | Status: DC | PRN

## 2024-12-01 MED ORDER — HYDROCOD POLI-CHLORPHE POLI ER 10-8 MG/5ML PO SUER: 5.0000 mL | Freq: Two times a day (BID) | ORAL | 0 refills | Status: AC | PRN

## 2024-12-01 NOTE — Progress Notes (Signed)
 [facility] [address] [place], [place] [address]  Internal MEDICINE  Office Visit Note  Patient Name: Alison Oliver  [id number]  [id number]  Date of Service: 12/01/2024  Chief Complaint  Patient presents with   Hyperlipidemia   Follow-up    HPI Mariellen presents for a follow-up visit for URI symptoms, anxiety, and routine screenings.  Possible URI -- reports that her husband came home sick yesterday and she woke up this morning with symptoms. She reports a fever, cough, chills, body aches, sore throat, Headache, fatigue.  Nausea when she eats esp with red meat. No known tick bite.  No issues with anxiety recently. Rarely takes any alprazolam .  Due for mammogram soon Due for 1 more pap smear     Current Medication: Outpatient Encounter Medications as of 12/01/2024  Medication Sig   chlorpheniramine-HYDROcodone (TUSSIONEX) 10-8 MG/5ML Take 5 mLs by mouth every 12 (twelve) hours as needed for cough.   doxycycline  (VIBRA -TABS) 100 MG tablet Take 1 tablet (100 mg total) by mouth 2 (two) times daily for 10 days. On empty stomach.   ALPRAZolam  (XANAX ) 0.5 MG tablet Take 1 tablet (0.5 mg total) by mouth at bedtime as needed for anxiety.   desoximetasone  (TOPICORT ) 0.25 % cream APPLY  CREAM EXTERNALLY TO AFFECTED AREA TWICE DAILY   fluticasone  (FLONASE ) 50 MCG/ACT nasal spray Place 2 sprays into both nostrils daily.   simvastatin  (ZOCOR ) 20 MG tablet Take 1 tablet (20 mg total) by mouth at bedtime.   triamcinolone  (KENALOG ) 0.025 % cream Apply 1 application topically 2 (two) times daily.   [DISCONTINUED] hyoscyamine  (ANASPAZ ) 0.125 MG TBDP disintergrating tablet Place 1 tablet (0.125 mg total) under the tongue every 6 (six) hours as needed for cramping. (Patient not taking: Reported on 12/01/2024)   [DISCONTINUED] ondansetron  (ZOFRAN -ODT) 4 MG disintegrating tablet Take 1 tablet (4 mg total) by mouth every 8 (eight) hours as needed for up to 12 doses for nausea or vomiting.  (Patient not taking: Reported on 12/01/2024)   [DISCONTINUED] oxycodone  (OXY-IR) 5 MG capsule Take 1 capsule (5 mg total) by mouth every 8 (eight) hours as needed for up to 8 doses. (Patient not taking: Reported on 12/01/2024)   No facility-administered encounter medications on file as of 12/01/2024.    Surgical History: Past Surgical History:  Procedure Laterality Date   CATARACT EXTRACTION, BILATERAL Bilateral 09/21,10/21   CESAREAN SECTION      Medical History: Past Medical History:  Diagnosis Date   Basal cell carcinoma 02/13/2010   Left mid ant. thigh. Superficial.    Hx of basal cell carcinoma 02/02/2020   Left upper lip infranasal. Nodular pattern   Hyperlipidemia    Osteoporosis     Family History: Family History  Problem Relation Age of Onset   Breast cancer Maternal Aunt 55   Breast cancer Maternal Grandmother    Bladder Cancer Father    Prostate cancer Brother     Social History   Socioeconomic History   Marital status: Married    Spouse name: Not on file   Number of children: Not on file   Years of education: Not on file   Highest education level: Not on file  Occupational History   Not on file  Tobacco Use   Smoking status: Never   Smokeless tobacco: Never  Substance and Sexual Activity   Alcohol use: Not Currently    Comment: social    Drug use: Never   Sexual activity: Not on file  Other Topics Concern  Not on file  Social History Narrative   Not on file   Social Drivers of Health   Tobacco Use: Low Risk (12/01/2024)   Patient History    Smoking Tobacco Use: Never    Smokeless Tobacco Use: Never    Passive Exposure: Not on file  Recent Concern: Tobacco Use - Medium Risk (11/23/2024)   Received from [facility]   Patient History    Smoking Tobacco Use: Former    Smokeless Tobacco Use: Never    Passive Exposure: Not on file  Financial Resource Strain: Low Risk  (11/23/2024)   Received from [facility]    Overall Financial Resource Strain (CARDIA)    Difficulty of Paying Living Expenses: Not very hard  Food Insecurity: No Food Insecurity (11/23/2024)   Received from [facility]   Epic    Within the past 12 months, you worried that your food would run out before you got the money to buy more.: Never true    Within the past 12 months, the food you bought just didn't last and you didn't have money to get more.: Never true  Transportation Needs: No Transportation Needs (11/23/2024)   Received from [facility] - Transportation    In the past 12 months, has lack of transportation kept you from medical appointments or from getting medications?: No    Lack of Transportation (Non-Medical): No  Physical Activity: Not on file  Stress: Not on file  Social Connections: Not on file  Intimate Partner Violence: Not on file  Depression (PHQ2-9): Low Risk (04/28/2024)   Depression (PHQ2-9)    PHQ-2 Score: 0  Alcohol Screen: Low Risk (03/15/2022)   Alcohol Screen    Last Alcohol Screening Score (AUDIT): 2  Housing: Low Risk  (11/23/2024)   Received from [facility]   Epic    In the last 12 months, was there a time when you were not able to pay the mortgage or rent on time?: No    In the past 12 months, how many times have you moved where you were living?: 0    At any time in the past 12 months, were you homeless or living in a shelter (including now)?: No  Utilities: Not At Risk (11/23/2024)   Received from [facility]   Epic    In the past 12 months has the electric, gas, oil, or water company threatened to shut off services in your home?: No  Health Literacy: Not on file      Review of Systems  Constitutional:  Positive for chills, fatigue and fever. Negative for unexpected weight change.  HENT:  Positive for congestion, postnasal drip, rhinorrhea and sore throat. Negative for sinus pressure, sinus pain and sneezing.    Eyes:  Negative for redness.  Respiratory:  Positive for cough. Negative for chest tightness, shortness of breath and wheezing.   Cardiovascular: Negative.  Negative for chest pain and palpitations.  Gastrointestinal:  Positive for nausea. Negative for abdominal pain, constipation, diarrhea and vomiting.  Genitourinary:  Negative for dysuria and frequency.  Musculoskeletal:  Positive for arthralgias and myalgias. Negative for back pain, joint swelling and neck pain.  Skin:  Negative for rash.  Neurological:  Positive for headaches. Negative for tremors and numbness.  Hematological:  Negative for adenopathy. Does not bruise/bleed easily.  Psychiatric/Behavioral:  Negative for behavioral problems (Depression), self-injury, sleep disturbance and suicidal ideas. The patient is not nervous/anxious.  Vital Signs: BP 120/70   Pulse (!) 112   Temp 98.2 F (36.8 C)   Resp 16   Ht 4' 11 (1.499 m)   Wt 120 lb 12.8 oz (54.8 kg)   SpO2 94%   BMI 24.40 kg/m    Physical Exam Vitals reviewed.  Constitutional:      General: She is not in acute distress.    Appearance: Normal appearance. She is normal weight. She is not ill-appearing.  HENT:     Head: Normocephalic and atraumatic.     Right Ear: Tympanic membrane, ear canal and external ear normal.     Left Ear: Tympanic membrane, ear canal and external ear normal.     Nose: Congestion and rhinorrhea present.     Mouth/Throat:     Mouth: Mucous membranes are moist.     Pharynx: Posterior oropharyngeal erythema present.  Eyes:     Extraocular Movements: Extraocular movements intact.     Conjunctiva/sclera: Conjunctivae normal.     Pupils: Pupils are equal, round, and reactive to light.  Cardiovascular:     Rate and Rhythm: Normal rate and regular rhythm.     Heart sounds: Normal heart sounds. No murmur heard. Pulmonary:     Effort: Pulmonary effort is normal. No respiratory distress.     Breath sounds: No stridor. No wheezing or  rales.  Abdominal:     General: Bowel sounds are normal.     Palpations: Abdomen is soft.  Musculoskeletal:        General: Normal range of motion.  Skin:    General: Skin is warm and dry.     Capillary Refill: Capillary refill takes less than 2 seconds.  Neurological:     Mental Status: She is alert and oriented to person, place, and time.  Psychiatric:        Mood and Affect: Mood normal.        Behavior: Behavior normal.        Thought Content: Thought content normal.        Judgment: Judgment normal.        Assessment/Plan: 1. Upper respiratory tract infection, unspecified type (Primary) Doxycycline  prescribed, take until gone.  - doxycycline  (VIBRA -TABS) 100 MG tablet; Take 1 tablet (100 mg total) by mouth 2 (two) times daily for 10 days. On empty stomach.  Dispense: 20 tablet; Refill: 0  2. Acute cough Cough syrup prescribed as needed. Routine labs ordered  - chlorpheniramine-HYDROcodone (TUSSIONEX) 10-8 MG/5ML; Take 5 mLs by mouth every 12 (twelve) hours as needed for cough.  Dispense: 140 mL; Refill: 0 - CBC with Differential/Platelet - CMP14+EGFR - Lipid Profile - Vitamin D  (25 hydroxy)  3. Sore throat Negative for strep - POCT rapid strep A  4. Flu-like symptoms Negative for flu A and B - POCT Influenza A/B  5. Mixed hyperlipidemia Routine labs ordered  - CBC with Differential/Platelet - CMP14+EGFR - Lipid Profile - Vitamin D  (25 hydroxy)  6. Vitamin D  deficiency Routine labs ordered  - CBC with Differential/Platelet - CMP14+EGFR - Lipid Profile - Vitamin D  (25 hydroxy)  7. Encounter for screening mammogram for malignant neoplasm of breast Routine mammogram ordered - MM 3D SCREENING MAMMOGRAM BILATERAL BREAST; Future   General Counseling: Avonda verbalizes understanding of the findings of todays visit and agrees with plan of treatment. I have discussed any further diagnostic evaluation that may be needed or ordered today. We also reviewed her  medications today. she has been encouraged to call the office with any  questions or concerns that should arise related to todays visit.    Orders Placed This Encounter  Procedures   MM 3D SCREENING MAMMOGRAM BILATERAL BREAST   CBC with Differential/Platelet   CMP14+EGFR   Lipid Profile   Vitamin D  (25 hydroxy)   POCT rapid strep A   POCT Influenza A/B    Meds ordered this encounter  Medications   chlorpheniramine-HYDROcodone (TUSSIONEX) 10-8 MG/5ML    Sig: Take 5 mLs by mouth every 12 (twelve) hours as needed for cough.    Dispense:  140 mL    Refill:  0    Fill new script today   doxycycline  (VIBRA -TABS) 100 MG tablet    Sig: Take 1 tablet (100 mg total) by mouth 2 (two) times daily for 10 days. On empty stomach.    Dispense:  20 tablet    Refill:  0    Fill new script today    Return for previously scheduled, CPE, Faysal Fenoglio PCP in june, have labs done before visit. .   Total time spent:30 Minutes Time spent includes review of chart, medications, test results, and follow up plan with the patient.   [place] Controlled Substance Database was reviewed by me.  This patient was seen by Mardy Maxin, FNP-C in collaboration with Dr. Sigrid Bathe as a part of collaborative care agreement.   Jayani Rozman R. Maxin, MSN, FNP-C Internal medicine

## 2024-12-08 ENCOUNTER — Telehealth: Payer: Self-pay

## 2024-12-09 ENCOUNTER — Other Ambulatory Visit: Payer: Self-pay

## 2024-12-09 MED ORDER — PREDNISONE 10 MG PO TABS: ORAL_TABLET | ORAL | 0 refills | Status: AC

## 2024-12-09 MED ORDER — ALBUTEROL SULFATE HFA 108 (90 BASE) MCG/ACT IN AERS: 1.0000 | INHALATION_SPRAY | Freq: Four times a day (QID) | RESPIRATORY_TRACT | 0 refills | Status: AC | PRN

## 2024-12-09 NOTE — Telephone Encounter (Signed)
 Pt called that she unable to take cough syrup due to side effect nausea and still coughing,wheezing and sob as per alyssa sent albuterol  and prednisone  and advised if not better need appt

## 2024-12-10 NOTE — Telephone Encounter (Signed)
 done

## 2024-12-25 ENCOUNTER — Encounter: Payer: Self-pay | Admitting: Nurse Practitioner

## 2024-12-25 ENCOUNTER — Telehealth: Admitting: Nurse Practitioner

## 2024-12-25 VITALS — Resp 16 | Ht 59.0 in | Wt 120.0 lb

## 2024-12-25 DIAGNOSIS — R053 Chronic cough: Secondary | ICD-10-CM

## 2024-12-25 DIAGNOSIS — J069 Acute upper respiratory infection, unspecified: Secondary | ICD-10-CM

## 2024-12-25 MED ORDER — DOXYCYCLINE HYCLATE 100 MG PO TABS: 100.0000 mg | ORAL_TABLET | Freq: Two times a day (BID) | ORAL | 0 refills | Status: AC

## 2024-12-25 MED ORDER — BENZONATATE 200 MG PO CAPS: 200.0000 mg | ORAL_CAPSULE | Freq: Three times a day (TID) | ORAL | 0 refills | Status: AC | PRN

## 2024-12-25 NOTE — Progress Notes (Signed)
 [facility] [address] [place], [place] [address]  Internal MEDICINE  Telephone Visit  Patient Name: Alison Oliver  [id number]  [id number]  Date of Service: 12/25/2024  I connected with the patient at 1205 by telephone and verified the patients identity using two identifiers.   I discussed the limitations, risks, security and privacy concerns of performing an evaluation and management service by telephone and the availability of in person appointments. I also discussed with the patient that there may be a patient responsible charge related to the service.  The patient expressed understanding and agrees to proceed.    Chief Complaint  Patient presents with   Telephone Screen    Coughing going 3 weeks   Telephone Assessment    HPI Hiliary presents for a telehealth virtual visit for persistent cough x3 weeks Additional symptoms include nasal congestion, dry nose, runny dose, sometimes yellow drainage. Also reports still have slight sinus pressure She was treated for a URI a few weeks ago. With doxycycline  and prescription cough medication.     Current Medication: Outpatient Encounter Medications as of 12/25/2024  Medication Sig   benzonatate  (TESSALON ) 200 MG capsule Take 1 capsule (200 mg total) by mouth 3 (three) times daily as needed for cough.   doxycycline  (VIBRA -TABS) 100 MG tablet Take 1 tablet (100 mg total) by mouth 2 (two) times daily for 7 days.   albuterol  (VENTOLIN  HFA) 108 (90 Base) MCG/ACT inhaler Inhale 1-2 puffs into the lungs every 6 (six) hours as needed for wheezing or shortness of breath.   ALPRAZolam  (XANAX ) 0.5 MG tablet Take 1 tablet (0.5 mg total) by mouth at bedtime as needed for anxiety.   chlorpheniramine-HYDROcodone (TUSSIONEX) 10-8 MG/5ML Take 5 mLs by mouth every 12 (twelve) hours as needed for cough.   desoximetasone  (TOPICORT ) 0.25 % cream APPLY  CREAM EXTERNALLY TO AFFECTED AREA TWICE DAILY   fluticasone  (FLONASE ) 50 MCG/ACT nasal  spray Place 2 sprays into both nostrils daily.   predniSONE  (DELTASONE ) 10 MG tablet Us  as directed for 6 days taper   simvastatin  (ZOCOR ) 20 MG tablet Take 1 tablet (20 mg total) by mouth at bedtime.   triamcinolone  (KENALOG ) 0.025 % cream Apply 1 application topically 2 (two) times daily.   No facility-administered encounter medications on file as of 12/25/2024.    Surgical History: Past Surgical History:  Procedure Laterality Date   CATARACT EXTRACTION, BILATERAL Bilateral 09/21,10/21   CESAREAN SECTION      Medical History: Past Medical History:  Diagnosis Date   Basal cell carcinoma 02/13/2010   Left mid ant. thigh. Superficial.    Hx of basal cell carcinoma 02/02/2020   Left upper lip infranasal. Nodular pattern   Hyperlipidemia    Osteoporosis     Family History: Family History  Problem Relation Age of Onset   Breast cancer Maternal Aunt 55   Breast cancer Maternal Grandmother    Bladder Cancer Father    Prostate cancer Brother     Social History   Socioeconomic History   Marital status: Married    Spouse name: Not on file   Number of children: Not on file   Years of education: Not on file   Highest education level: Not on file  Occupational History   Not on file  Tobacco Use   Smoking status: Never   Smokeless tobacco: Never  Substance and Sexual Activity   Alcohol use: Not Currently    Comment: social    Drug use: Never   Sexual  activity: Not on file  Other Topics Concern   Not on file  Social History Narrative   Not on file   Social Drivers of Health   Tobacco Use: Low Risk (12/25/2024)   Patient History    Smoking Tobacco Use: Never    Smokeless Tobacco Use: Never    Passive Exposure: Not on file  Recent Concern: Tobacco Use - Medium Risk (11/23/2024)   Received from [facility]   Patient History    Smoking Tobacco Use: Former    Smokeless Tobacco Use: Never    Passive Exposure: Not on file  Financial Resource Strain:  Low Risk  (11/23/2024)   Received from [facility]   Overall Financial Resource Strain (CARDIA)    Difficulty of Paying Living Expenses: Not very hard  Food Insecurity: No Food Insecurity (11/23/2024)   Received from [facility]   Epic    Within the past 12 months, you worried that your food would run out before you got the money to buy more.: Never true    Within the past 12 months, the food you bought just didn't last and you didn't have money to get more.: Never true  Transportation Needs: No Transportation Needs (11/23/2024)   Received from [facility] - Transportation    In the past 12 months, has lack of transportation kept you from medical appointments or from getting medications?: No    Lack of Transportation (Non-Medical): No  Physical Activity: Not on file  Stress: Not on file  Social Connections: Not on file  Intimate Partner Violence: Not on file  Depression (PHQ2-9): Low Risk (04/28/2024)   Depression (PHQ2-9)    PHQ-2 Score: 0  Alcohol Screen: Low Risk (03/15/2022)   Alcohol Screen    Last Alcohol Screening Score (AUDIT): 2  Housing: Low Risk  (11/23/2024)   Received from [facility]   Epic    In the last 12 months, was there a time when you were not able to pay the mortgage or rent on time?: No    In the past 12 months, how many times have you moved where you were living?: 0    At any time in the past 12 months, were you homeless or living in a shelter (including now)?: No  Utilities: Not At Risk (11/23/2024)   Received from [facility]   Epic    In the past 12 months has the electric, gas, oil, or water company threatened to shut off services in your home?: No  Health Literacy: Not on file      Review of Systems  Constitutional:  Positive for fatigue. Negative for chills, fever and unexpected weight change.  HENT:  Positive for congestion, postnasal drip,  rhinorrhea and sinus pressure. Negative for sinus pain, sneezing and sore throat.   Eyes:  Negative for redness.  Respiratory:  Positive for cough. Negative for chest tightness, shortness of breath and wheezing.   Cardiovascular: Negative.  Negative for chest pain and palpitations.  Gastrointestinal:  Negative for abdominal pain, constipation, diarrhea, nausea and vomiting.  Genitourinary:  Negative for dysuria and frequency.  Musculoskeletal:  Positive for arthralgias. Negative for back pain, joint swelling, myalgias and neck pain.  Skin:  Negative for rash.  Neurological:  Negative for tremors, numbness and headaches.  Hematological:  Negative for adenopathy. Does not bruise/bleed easily.  Psychiatric/Behavioral:  Negative for behavioral problems (Depression), self-injury, sleep disturbance and suicidal ideas.  The patient is not nervous/anxious.     Vital Signs: Resp 16   Ht 4' 11 (1.499 m)   Wt 120 lb (54.4 kg)   BMI 24.24 kg/m    Observation/Objective: She is alert and oriented. No acute distress noted.     Assessment/Plan: 1. Persistent cough for 3 weeks or longer (Primary) Cough medication prescribed.  - benzonatate  (TESSALON ) 200 MG capsule; Take 1 capsule (200 mg total) by mouth 3 (three) times daily as needed for cough.  Dispense: 45 capsule; Refill: 0  2. Upper respiratory tract infection, unspecified type Doxycycline  prescribed, take if symptoms do not improve or worsen.  - doxycycline  (VIBRA -TABS) 100 MG tablet; Take 1 tablet (100 mg total) by mouth 2 (two) times daily for 7 days.  Dispense: 14 tablet; Refill: 0   General Counseling: Kimberly verbalizes understanding of the findings of today's phone visit and agrees with plan of treatment. I have discussed any further diagnostic evaluation that may be needed or ordered today. We also reviewed her medications today. she has been encouraged to call the office with any questions or concerns that should arise related to  todays visit.  Return if symptoms worsen or fail to improve.   No orders of the defined types were placed in this encounter.   Meds ordered this encounter  Medications   benzonatate  (TESSALON ) 200 MG capsule    Sig: Take 1 capsule (200 mg total) by mouth 3 (three) times daily as needed for cough.    Dispense:  45 capsule    Refill:  0   doxycycline  (VIBRA -TABS) 100 MG tablet    Sig: Take 1 tablet (100 mg total) by mouth 2 (two) times daily for 7 days.    Dispense:  14 tablet    Refill:  0    Fill new script today.    Time spent:30 Minutes Time spent with patient included reviewing progress notes, labs, imaging studies, and discussing plan for follow up.  [place] Controlled Substance Database was reviewed by me for overdose risk score (ORS) if appropriate.  This patient was seen by Mardy Maxin, FNP-C in collaboration with Dr. Sigrid Bathe as a part of collaborative care agreement.  Nikai Quest R. Maxin, MSN, FNP-C Internal medicine

## 2024-12-28 ENCOUNTER — Ambulatory Visit: Admitting: Dermatology

## 2025-02-10 ENCOUNTER — Encounter

## 2025-03-25 ENCOUNTER — Ambulatory Visit

## 2025-04-29 ENCOUNTER — Other Ambulatory Visit: Admitting: Nurse Practitioner
